# Patient Record
Sex: Female | Born: 1945 | Marital: Single | Smoking: Never smoker
Health system: Southern US, Community
[De-identification: ages and names within clinical notes are randomized; demographics above are authoritative.]

## PROBLEM LIST (undated history)

## (undated) DIAGNOSIS — I10 Essential (primary) hypertension: Secondary | ICD-10-CM

## (undated) DIAGNOSIS — E785 Hyperlipidemia, unspecified: Secondary | ICD-10-CM

## (undated) DIAGNOSIS — E119 Type 2 diabetes mellitus without complications: Secondary | ICD-10-CM

## (undated) DIAGNOSIS — N185 Chronic kidney disease, stage 5: Secondary | ICD-10-CM

## (undated) DIAGNOSIS — Z8673 Personal history of transient ischemic attack (TIA), and cerebral infarction without residual deficits: Secondary | ICD-10-CM

---

## 2004-05-06 ENCOUNTER — Emergency Department: Payer: Self-pay | Admitting: Emergency Medicine

## 2004-06-29 ENCOUNTER — Ambulatory Visit: Payer: Self-pay | Admitting: Internal Medicine

## 2005-07-09 ENCOUNTER — Ambulatory Visit: Payer: Self-pay | Admitting: Internal Medicine

## 2005-12-21 ENCOUNTER — Ambulatory Visit: Payer: Self-pay

## 2006-01-27 ENCOUNTER — Ambulatory Visit: Payer: Self-pay | Admitting: Otolaryngology

## 2006-01-27 ENCOUNTER — Other Ambulatory Visit: Payer: Self-pay

## 2006-02-02 ENCOUNTER — Ambulatory Visit: Payer: Self-pay | Admitting: Otolaryngology

## 2006-02-17 ENCOUNTER — Ambulatory Visit: Payer: Self-pay | Admitting: Oncology

## 2006-05-26 ENCOUNTER — Ambulatory Visit: Payer: Self-pay | Admitting: Oncology

## 2006-06-12 ENCOUNTER — Ambulatory Visit: Payer: Self-pay | Admitting: Oncology

## 2006-07-12 ENCOUNTER — Ambulatory Visit: Payer: Self-pay | Admitting: Internal Medicine

## 2006-12-12 ENCOUNTER — Ambulatory Visit: Payer: Self-pay | Admitting: Oncology

## 2006-12-15 ENCOUNTER — Ambulatory Visit: Payer: Self-pay | Admitting: Oncology

## 2007-01-12 ENCOUNTER — Ambulatory Visit: Payer: Self-pay | Admitting: Oncology

## 2007-03-14 ENCOUNTER — Ambulatory Visit: Payer: Self-pay | Admitting: Internal Medicine

## 2007-04-19 ENCOUNTER — Ambulatory Visit: Payer: Self-pay | Admitting: Internal Medicine

## 2007-05-17 ENCOUNTER — Ambulatory Visit: Payer: Self-pay | Admitting: Internal Medicine

## 2007-06-20 ENCOUNTER — Ambulatory Visit: Payer: Self-pay | Admitting: Oncology

## 2007-07-06 ENCOUNTER — Ambulatory Visit: Payer: Self-pay | Admitting: Oncology

## 2007-07-12 ENCOUNTER — Ambulatory Visit: Payer: Self-pay | Admitting: Oncology

## 2007-07-19 ENCOUNTER — Ambulatory Visit: Payer: Self-pay | Admitting: Internal Medicine

## 2008-06-11 ENCOUNTER — Ambulatory Visit: Payer: Self-pay | Admitting: Oncology

## 2008-07-01 ENCOUNTER — Ambulatory Visit: Payer: Self-pay | Admitting: Nephrology

## 2008-07-04 ENCOUNTER — Ambulatory Visit: Payer: Self-pay | Admitting: Oncology

## 2008-07-11 ENCOUNTER — Ambulatory Visit: Payer: Self-pay | Admitting: Oncology

## 2008-08-27 ENCOUNTER — Emergency Department: Payer: Self-pay

## 2008-09-19 ENCOUNTER — Ambulatory Visit: Payer: Self-pay | Admitting: Internal Medicine

## 2009-09-22 ENCOUNTER — Ambulatory Visit: Payer: Self-pay | Admitting: Internal Medicine

## 2010-07-13 ENCOUNTER — Ambulatory Visit: Payer: Self-pay

## 2010-07-18 ENCOUNTER — Inpatient Hospital Stay: Payer: Self-pay | Admitting: Internal Medicine

## 2010-07-28 ENCOUNTER — Ambulatory Visit: Payer: Self-pay

## 2010-10-26 ENCOUNTER — Ambulatory Visit: Payer: Self-pay

## 2011-11-10 ENCOUNTER — Ambulatory Visit: Payer: Self-pay

## 2012-07-03 ENCOUNTER — Inpatient Hospital Stay: Payer: Self-pay | Admitting: Internal Medicine

## 2012-07-03 DIAGNOSIS — I214 Non-ST elevation (NSTEMI) myocardial infarction: Secondary | ICD-10-CM

## 2012-07-03 LAB — URINALYSIS, COMPLETE
Bilirubin,UR: NEGATIVE
Protein: >=500
RBC,UR: 5 /HPF (ref 0–5)
Specific Gravity: 1.012 (ref 1.003–1.030)
Squamous Epithelial: 11
WBC UR: 10 /HPF (ref 0–5)

## 2012-07-03 LAB — COMPREHENSIVE METABOLIC PANEL
Anion Gap: 11 (ref 7–16)
Bilirubin,Total: 0.4 mg/dL (ref 0.2–1.0)
Chloride: 104 mmol/L (ref 98–107)
Co2: 23 mmol/L (ref 21–32)
Creatinine: 4.8 mg/dL — ABNORMAL HIGH (ref 0.60–1.30)
SGOT(AST): 112 U/L — ABNORMAL HIGH (ref 15–37)
SGPT (ALT): 27 U/L (ref 12–78)

## 2012-07-03 LAB — CBC
MCHC: 32.2 g/dL (ref 32.0–36.0)
MCV: 70 fL — ABNORMAL LOW (ref 80–100)

## 2012-07-03 LAB — LIPID PANEL
HDL Cholesterol: 46 mg/dL (ref 40–60)
VLDL Cholesterol, Calc: 30 mg/dL (ref 5–40)

## 2012-07-03 LAB — HEMOGLOBIN A1C: Hemoglobin A1C: 6.5 % — ABNORMAL HIGH (ref 4.2–6.3)

## 2012-07-04 DIAGNOSIS — I059 Rheumatic mitral valve disease, unspecified: Secondary | ICD-10-CM

## 2012-07-04 DIAGNOSIS — I214 Non-ST elevation (NSTEMI) myocardial infarction: Secondary | ICD-10-CM

## 2012-07-04 LAB — CBC WITH DIFFERENTIAL/PLATELET
Basophil #: 0.1 10*3/uL (ref 0.0–0.1)
Basophil %: 0.4 %
Basophil %: 0.3 %
HCT: 28.9 % — ABNORMAL LOW (ref 35.0–47.0)
HCT: 32.8 % — ABNORMAL LOW (ref 35.0–47.0)
HGB: 9.1 g/dL — ABNORMAL LOW (ref 12.0–16.0)
HGB: 10.2 g/dL — ABNORMAL LOW (ref 12.0–16.0)
Lymphocyte #: 2 10*3/uL (ref 1.0–3.6)
Lymphocyte #: 1.5 10*3/uL (ref 1.0–3.6)
Lymphocyte %: 15 %
MCH: 22 pg — ABNORMAL LOW (ref 26.0–34.0)
MCHC: 31.6 g/dL — ABNORMAL LOW (ref 32.0–36.0)
MCHC: 31.2 g/dL — ABNORMAL LOW (ref 32.0–36.0)
MCV: 70 fL — ABNORMAL LOW (ref 80–100)
Monocyte #: 1.2 x10 3/mm — ABNORMAL HIGH (ref 0.2–0.9)
Neutrophil #: 15.3 10*3/uL — ABNORMAL HIGH (ref 1.4–6.5)
Neutrophil %: 74.7 %
RBC: 4.16 10*6/uL (ref 3.80–5.20)
RDW: 14.5 % (ref 11.5–14.5)
RDW: 14.5 % (ref 11.5–14.5)

## 2012-07-04 LAB — BASIC METABOLIC PANEL
BUN: 58 mg/dL — ABNORMAL HIGH (ref 7–18)
Chloride: 108 mmol/L — ABNORMAL HIGH (ref 98–107)
Co2: 19 mmol/L — ABNORMAL LOW (ref 21–32)
EGFR (Non-African Amer.): 9 — ABNORMAL LOW

## 2012-07-04 LAB — CK TOTAL AND CKMB (NOT AT ARMC)
CK, Total: 267 U/L — ABNORMAL HIGH (ref 21–215)
CK-MB: 18.9 ng/mL — ABNORMAL HIGH (ref 0.5–3.6)
CK-MB: 11.9 ng/mL — ABNORMAL HIGH (ref 0.5–3.6)

## 2012-07-04 LAB — APTT
Activated PTT: 102.7 secs — ABNORMAL HIGH (ref 23.6–35.9)
Activated PTT: 75.7 secs — ABNORMAL HIGH (ref 23.6–35.9)

## 2012-07-04 LAB — TROPONIN I: Troponin-I: 21 ng/mL — ABNORMAL HIGH

## 2012-07-05 ENCOUNTER — Other Ambulatory Visit: Payer: Self-pay | Admitting: *Deleted

## 2012-07-05 ENCOUNTER — Inpatient Hospital Stay (HOSPITAL_COMMUNITY): Payer: Medicare Other

## 2012-07-05 ENCOUNTER — Encounter (HOSPITAL_COMMUNITY): Payer: Self-pay | Admitting: Student

## 2012-07-05 ENCOUNTER — Encounter (HOSPITAL_COMMUNITY): Payer: Medicare Other

## 2012-07-05 ENCOUNTER — Inpatient Hospital Stay (HOSPITAL_COMMUNITY)
Admission: AD | Admit: 2012-07-05 | Discharge: 2012-08-11 | DRG: 208 | Disposition: E | Payer: Medicare Other | Source: Other Acute Inpatient Hospital | Attending: Cardiovascular Disease | Admitting: Cardiovascular Disease

## 2012-07-05 ENCOUNTER — Encounter (HOSPITAL_COMMUNITY)
Admission: AD | Disposition: E | Payer: Self-pay | Source: Other Acute Inpatient Hospital | Attending: Cardiovascular Disease

## 2012-07-05 ENCOUNTER — Ambulatory Visit (HOSPITAL_COMMUNITY): Admit: 2012-07-05 | Payer: Self-pay | Admitting: Cardiovascular Disease

## 2012-07-05 DIAGNOSIS — D509 Iron deficiency anemia, unspecified: Secondary | ICD-10-CM | POA: Diagnosis present

## 2012-07-05 DIAGNOSIS — E872 Acidosis, unspecified: Secondary | ICD-10-CM | POA: Diagnosis not present

## 2012-07-05 DIAGNOSIS — I255 Ischemic cardiomyopathy: Secondary | ICD-10-CM | POA: Diagnosis present

## 2012-07-05 DIAGNOSIS — I447 Left bundle-branch block, unspecified: Secondary | ICD-10-CM | POA: Diagnosis present

## 2012-07-05 DIAGNOSIS — R5381 Other malaise: Secondary | ICD-10-CM | POA: Diagnosis present

## 2012-07-05 DIAGNOSIS — I959 Hypotension, unspecified: Secondary | ICD-10-CM

## 2012-07-05 DIAGNOSIS — I69998 Other sequelae following unspecified cerebrovascular disease: Secondary | ICD-10-CM

## 2012-07-05 DIAGNOSIS — N179 Acute kidney failure, unspecified: Secondary | ICD-10-CM | POA: Diagnosis present

## 2012-07-05 DIAGNOSIS — M25519 Pain in unspecified shoulder: Secondary | ICD-10-CM | POA: Diagnosis present

## 2012-07-05 DIAGNOSIS — E785 Hyperlipidemia, unspecified: Secondary | ICD-10-CM | POA: Insufficient documentation

## 2012-07-05 DIAGNOSIS — I2510 Atherosclerotic heart disease of native coronary artery without angina pectoris: Secondary | ICD-10-CM

## 2012-07-05 DIAGNOSIS — N2581 Secondary hyperparathyroidism of renal origin: Secondary | ICD-10-CM | POA: Diagnosis present

## 2012-07-05 DIAGNOSIS — J189 Pneumonia, unspecified organism: Secondary | ICD-10-CM | POA: Diagnosis present

## 2012-07-05 DIAGNOSIS — I214 Non-ST elevation (NSTEMI) myocardial infarction: Secondary | ICD-10-CM | POA: Diagnosis present

## 2012-07-05 DIAGNOSIS — R791 Abnormal coagulation profile: Secondary | ICD-10-CM | POA: Diagnosis present

## 2012-07-05 DIAGNOSIS — J9600 Acute respiratory failure, unspecified whether with hypoxia or hypercapnia: Secondary | ICD-10-CM | POA: Diagnosis present

## 2012-07-05 DIAGNOSIS — E119 Type 2 diabetes mellitus without complications: Secondary | ICD-10-CM

## 2012-07-05 DIAGNOSIS — I498 Other specified cardiac arrhythmias: Secondary | ICD-10-CM | POA: Diagnosis present

## 2012-07-05 DIAGNOSIS — Z8673 Personal history of transient ischemic attack (TIA), and cerebral infarction without residual deficits: Secondary | ICD-10-CM | POA: Insufficient documentation

## 2012-07-05 DIAGNOSIS — I120 Hypertensive chronic kidney disease with stage 5 chronic kidney disease or end stage renal disease: Secondary | ICD-10-CM | POA: Diagnosis present

## 2012-07-05 DIAGNOSIS — Z794 Long term (current) use of insulin: Secondary | ICD-10-CM

## 2012-07-05 DIAGNOSIS — Z515 Encounter for palliative care: Secondary | ICD-10-CM

## 2012-07-05 DIAGNOSIS — I10 Essential (primary) hypertension: Secondary | ICD-10-CM | POA: Diagnosis present

## 2012-07-05 DIAGNOSIS — G9340 Encephalopathy, unspecified: Secondary | ICD-10-CM | POA: Diagnosis present

## 2012-07-05 DIAGNOSIS — J810 Acute pulmonary edema: Secondary | ICD-10-CM

## 2012-07-05 DIAGNOSIS — R4182 Altered mental status, unspecified: Secondary | ICD-10-CM | POA: Diagnosis present

## 2012-07-05 DIAGNOSIS — I219 Acute myocardial infarction, unspecified: Secondary | ICD-10-CM

## 2012-07-05 DIAGNOSIS — I059 Rheumatic mitral valve disease, unspecified: Secondary | ICD-10-CM

## 2012-07-05 DIAGNOSIS — N039 Chronic nephritic syndrome with unspecified morphologic changes: Secondary | ICD-10-CM | POA: Diagnosis present

## 2012-07-05 DIAGNOSIS — N185 Chronic kidney disease, stage 5: Secondary | ICD-10-CM

## 2012-07-05 DIAGNOSIS — N186 End stage renal disease: Secondary | ICD-10-CM | POA: Diagnosis present

## 2012-07-05 DIAGNOSIS — R5383 Other fatigue: Secondary | ICD-10-CM | POA: Diagnosis present

## 2012-07-05 DIAGNOSIS — I2589 Other forms of chronic ischemic heart disease: Secondary | ICD-10-CM | POA: Diagnosis present

## 2012-07-05 DIAGNOSIS — E1169 Type 2 diabetes mellitus with other specified complication: Secondary | ICD-10-CM | POA: Diagnosis present

## 2012-07-05 DIAGNOSIS — T68XXXA Hypothermia, initial encounter: Secondary | ICD-10-CM | POA: Diagnosis not present

## 2012-07-05 DIAGNOSIS — D631 Anemia in chronic kidney disease: Secondary | ICD-10-CM | POA: Diagnosis present

## 2012-07-05 DIAGNOSIS — Z7401 Bed confinement status: Secondary | ICD-10-CM

## 2012-07-05 DIAGNOSIS — R570 Cardiogenic shock: Secondary | ICD-10-CM | POA: Diagnosis present

## 2012-07-05 DIAGNOSIS — Z8249 Family history of ischemic heart disease and other diseases of the circulatory system: Secondary | ICD-10-CM

## 2012-07-05 HISTORY — DX: Essential (primary) hypertension: I10

## 2012-07-05 HISTORY — DX: Chronic kidney disease, stage 5: N18.5

## 2012-07-05 HISTORY — DX: Personal history of transient ischemic attack (TIA), and cerebral infarction without residual deficits: Z86.73

## 2012-07-05 HISTORY — DX: Type 2 diabetes mellitus without complications: E11.9

## 2012-07-05 HISTORY — DX: Hyperlipidemia, unspecified: E78.5

## 2012-07-05 LAB — CBC WITH DIFFERENTIAL/PLATELET
Basophil #: 0 10*3/uL (ref 0.0–0.1)
Basophils Relative: 0 % (ref 0–1)
Eosinophil #: 0 10*3/uL (ref 0.0–0.7)
Eosinophils Absolute: 0 10*3/uL (ref 0.0–0.7)
Eosinophils Relative: 0 % (ref 0–5)
HGB: 10.4 g/dL — ABNORMAL LOW (ref 12.0–16.0)
Hemoglobin: 10.8 g/dL — ABNORMAL LOW (ref 12.0–15.0)
Lymphocyte #: 1.6 10*3/uL (ref 1.0–3.6)
Lymphocyte %: 8.3 %
Lymphocytes Relative: 7 % — ABNORMAL LOW (ref 12–46)
MCHC: 33.2 g/dL (ref 30.0–36.0)
Neutrophils Relative %: 87 % — ABNORMAL HIGH (ref 43–77)
RBC: 4.81 MIL/uL (ref 3.87–5.11)
RDW: 14.6 % — ABNORMAL HIGH (ref 11.5–14.5)
WBC: 19.4 10*3/uL — ABNORMAL HIGH (ref 3.6–11.0)

## 2012-07-05 LAB — POCT I-STAT 3, ART BLOOD GAS (G3+)
Acid-base deficit: 3 mmol/L — ABNORMAL HIGH (ref 0.0–2.0)
Bicarbonate: 20 mEq/L (ref 20.0–24.0)
O2 Saturation: 96 %
Patient temperature: 98.6
pO2, Arterial: 79 mmHg — ABNORMAL LOW (ref 80.0–100.0)

## 2012-07-05 LAB — HEMOGLOBIN A1C
Hgb A1c MFr Bld: 6.4 % — ABNORMAL HIGH (ref <5.7)
Mean Plasma Glucose: 137 mg/dL — ABNORMAL HIGH (ref <117)

## 2012-07-05 LAB — URINALYSIS, ROUTINE W REFLEX MICROSCOPIC
Bilirubin Urine: NEGATIVE
Glucose, UA: NEGATIVE mg/dL
Ketones, ur: NEGATIVE mg/dL
Nitrite: NEGATIVE
Protein, ur: 100 mg/dL — AB
Specific Gravity, Urine: 1.014 (ref 1.005–1.030)
Urobilinogen, UA: 0.2 mg/dL (ref 0.0–1.0)
pH: 5.5 (ref 5.0–8.0)

## 2012-07-05 LAB — BASIC METABOLIC PANEL
Calcium, Total: 9.1 mg/dL (ref 8.5–10.1)
Co2: 22 mmol/L (ref 21–32)
Creatinine: 4.95 mg/dL — ABNORMAL HIGH (ref 0.60–1.30)
EGFR (African American): 10 — ABNORMAL LOW
Osmolality: 290 (ref 275–301)
Potassium: 3.5 mmol/L (ref 3.5–5.1)

## 2012-07-05 LAB — URINE MICROSCOPIC-ADD ON

## 2012-07-05 LAB — COMPREHENSIVE METABOLIC PANEL
Albumin: 2.4 g/dL — ABNORMAL LOW (ref 3.5–5.2)
Alkaline Phosphatase: 63 U/L (ref 39–117)
BUN: 53 mg/dL — ABNORMAL HIGH (ref 6–23)
Calcium: 9.2 mg/dL (ref 8.4–10.5)
Potassium: 3.1 mEq/L — ABNORMAL LOW (ref 3.5–5.1)
Total Protein: 6.6 g/dL (ref 6.0–8.3)

## 2012-07-05 LAB — HEPARIN LEVEL (UNFRACTIONATED): Heparin Unfractionated: 0.65 IU/mL (ref 0.30–0.70)

## 2012-07-05 LAB — PROTIME-INR: Prothrombin Time: 14.6 seconds (ref 11.6–15.2)

## 2012-07-05 LAB — TROPONIN I: Troponin I: 10.83 ng/mL (ref <0.30)

## 2012-07-05 LAB — SURGICAL PCR SCREEN
MRSA, PCR: NEGATIVE
Staphylococcus aureus: NEGATIVE

## 2012-07-05 LAB — PRO B NATRIURETIC PEPTIDE: Pro B Natriuretic peptide (BNP): 49043 pg/mL — ABNORMAL HIGH (ref 0–125)

## 2012-07-05 LAB — MRSA PCR SCREENING: MRSA by PCR: NEGATIVE

## 2012-07-05 LAB — MAGNESIUM: Magnesium: 2 mg/dL (ref 1.5–2.5)

## 2012-07-05 SURGERY — LEFT HEART CATHETERIZATION WITH CORONARY ANGIOGRAM
Anesthesia: LOCAL

## 2012-07-05 MED ORDER — HEPARIN (PORCINE) IN NACL 100-0.45 UNIT/ML-% IJ SOLN
850.0000 [IU]/h | INTRAMUSCULAR | Status: DC
  Administered 2012-07-05: 850 [IU]/h via INTRAVENOUS
  Filled 2012-07-05: qty 250

## 2012-07-05 MED ORDER — DEXTROSE 10 % IV SOLN
INTRAVENOUS | Status: DC
  Administered 2012-07-06: via INTRAVENOUS

## 2012-07-05 MED ORDER — HEPARIN (PORCINE) 2000 UNITS/L FOR CRRT
INTRAVENOUS_CENTRAL | Status: DC | PRN
  Administered 2012-07-05 – 2012-07-15 (×2): via INTRAVENOUS_CENTRAL
  Filled 2012-07-05: qty 1000

## 2012-07-05 MED ORDER — BIOTENE DRY MOUTH MT LIQD
15.0000 mL | OROMUCOSAL | Status: DC
  Administered 2012-07-05 – 2012-07-07 (×12): 15 mL via OROMUCOSAL

## 2012-07-05 MED ORDER — PRISMASOL BGK 4/2.5 32-4-2.5 MEQ/L IV SOLN
INTRAVENOUS | Status: DC
  Administered 2012-07-05 – 2012-07-06 (×3): via INTRAVENOUS_CENTRAL
  Filled 2012-07-05 (×6): qty 5000

## 2012-07-05 MED ORDER — DEXTROSE 50 % IV SOLN
INTRAVENOUS | Status: AC
  Administered 2012-07-05: 12.5 g
  Filled 2012-07-05: qty 50

## 2012-07-05 MED ORDER — PRISMASOL BGK 4/2.5 32-4-2.5 MEQ/L IV SOLN
INTRAVENOUS | Status: DC
  Administered 2012-07-05 – 2012-07-07 (×11): via INTRAVENOUS_CENTRAL
  Filled 2012-07-05 (×17): qty 5000

## 2012-07-05 MED ORDER — NITROGLYCERIN 0.2 MG/ML ON CALL CATH LAB
INTRAVENOUS | Status: AC
  Filled 2012-07-05: qty 1

## 2012-07-05 MED ORDER — FENTANYL BOLUS VIA INFUSION
25.0000 ug | Freq: Four times a day (QID) | INTRAVENOUS | Status: DC | PRN
  Filled 2012-07-05: qty 100

## 2012-07-05 MED ORDER — ASPIRIN 325 MG PO TABS
325.0000 mg | ORAL_TABLET | Freq: Every day | ORAL | Status: DC
  Administered 2012-07-06 – 2012-07-14 (×8): 325 mg via ORAL
  Filled 2012-07-05 (×10): qty 1

## 2012-07-05 MED ORDER — PRISMASOL BGK 4/2.5 32-4-2.5 MEQ/L IV SOLN
INTRAVENOUS | Status: DC
  Administered 2012-07-05 – 2012-07-06 (×2): via INTRAVENOUS_CENTRAL
  Filled 2012-07-05 (×3): qty 5000

## 2012-07-05 MED ORDER — HEPARIN (PORCINE) IN NACL 2-0.9 UNIT/ML-% IJ SOLN
INTRAMUSCULAR | Status: AC
  Filled 2012-07-05: qty 1000

## 2012-07-05 MED ORDER — PROPOFOL 10 MG/ML IV EMUL
5.0000 ug/kg/min | INTRAVENOUS | Status: DC
  Administered 2012-07-05 – 2012-07-06 (×2): 25 ug/kg/min via INTRAVENOUS
  Filled 2012-07-05 (×4): qty 100

## 2012-07-05 MED ORDER — PARICALCITOL 1 MCG PO CAPS
1.0000 ug | ORAL_CAPSULE | Freq: Every day | ORAL | Status: DC
  Administered 2012-07-06 – 2012-07-11 (×5): 1 ug via ORAL
  Filled 2012-07-05 (×7): qty 1

## 2012-07-05 MED ORDER — INSULIN ASPART 100 UNIT/ML ~~LOC~~ SOLN
2.0000 [IU] | SUBCUTANEOUS | Status: DC
  Administered 2012-07-06: 6 [IU] via SUBCUTANEOUS
  Administered 2012-07-06: 4 [IU] via SUBCUTANEOUS
  Administered 2012-07-06: 2 [IU] via SUBCUTANEOUS
  Administered 2012-07-07: 4 [IU] via SUBCUTANEOUS
  Administered 2012-07-07: 6 [IU] via SUBCUTANEOUS
  Administered 2012-07-07: 4 [IU] via SUBCUTANEOUS
  Administered 2012-07-07: 6 [IU] via SUBCUTANEOUS
  Administered 2012-07-08: 4 [IU] via SUBCUTANEOUS
  Administered 2012-07-08 (×2): 2 [IU] via SUBCUTANEOUS
  Administered 2012-07-08: 6 [IU] via SUBCUTANEOUS
  Administered 2012-07-08 (×2): 4 [IU] via SUBCUTANEOUS
  Administered 2012-07-09 (×2): 2 [IU] via SUBCUTANEOUS
  Administered 2012-07-09 (×3): 4 [IU] via SUBCUTANEOUS
  Administered 2012-07-10: 2 [IU] via SUBCUTANEOUS
  Administered 2012-07-10: 4 [IU] via SUBCUTANEOUS
  Administered 2012-07-10 (×3): 2 [IU] via SUBCUTANEOUS
  Administered 2012-07-11 (×3): 4 [IU] via SUBCUTANEOUS
  Administered 2012-07-11: 6 [IU] via SUBCUTANEOUS
  Administered 2012-07-11 – 2012-07-12 (×3): 4 [IU] via SUBCUTANEOUS
  Administered 2012-07-12: 2 [IU] via SUBCUTANEOUS
  Administered 2012-07-12 – 2012-07-13 (×3): 4 [IU] via SUBCUTANEOUS
  Administered 2012-07-13 (×3): 2 [IU] via SUBCUTANEOUS
  Administered 2012-07-13: 4 [IU] via SUBCUTANEOUS
  Administered 2012-07-14 (×4): 2 [IU] via SUBCUTANEOUS

## 2012-07-05 MED ORDER — SODIUM CHLORIDE 0.9 % IV SOLN
25.0000 ug/h | INTRAVENOUS | Status: DC
  Administered 2012-07-05: 150 ug/h via INTRAVENOUS
  Administered 2012-07-06: 100 ug/h via INTRAVENOUS
  Filled 2012-07-05 (×3): qty 50

## 2012-07-05 MED ORDER — PANTOPRAZOLE SODIUM 40 MG IV SOLR
40.0000 mg | Freq: Every day | INTRAVENOUS | Status: DC
  Administered 2012-07-05 – 2012-07-06 (×2): 40 mg via INTRAVENOUS
  Filled 2012-07-05 (×3): qty 40

## 2012-07-05 MED ORDER — DEXTROSE 50 % IV SOLN
12.5000 g | Freq: Once | INTRAVENOUS | Status: AC
  Administered 2012-07-06: 12.5 g via INTRAVENOUS

## 2012-07-05 MED ORDER — HEPARIN SODIUM (PORCINE) 1000 UNIT/ML DIALYSIS
1000.0000 [IU] | INTRAMUSCULAR | Status: DC | PRN
  Administered 2012-07-07: 4200 [IU] via INTRAVENOUS_CENTRAL
  Administered 2012-07-15: 1200 [IU] via INTRAVENOUS_CENTRAL
  Filled 2012-07-05 (×2): qty 3
  Filled 2012-07-05: qty 6
  Filled 2012-07-05: qty 2

## 2012-07-05 MED ORDER — CARVEDILOL 12.5 MG PO TABS
12.5000 mg | ORAL_TABLET | Freq: Two times a day (BID) | ORAL | Status: DC
  Filled 2012-07-05 (×3): qty 1

## 2012-07-05 MED ORDER — FERROUS SULFATE 325 (65 FE) MG PO TABS
325.0000 mg | ORAL_TABLET | Freq: Every day | ORAL | Status: DC
  Administered 2012-07-06 – 2012-07-08 (×3): 325 mg via ORAL
  Filled 2012-07-05 (×6): qty 1

## 2012-07-05 MED ORDER — ASPIRIN EC 81 MG PO TBEC: 81.0000 mg | DELAYED_RELEASE_TABLET | Freq: Every day | ORAL | Status: DC

## 2012-07-05 MED ORDER — HEPARIN (PORCINE) IN NACL 100-0.45 UNIT/ML-% IJ SOLN
1200.0000 [IU]/h | INTRAMUSCULAR | Status: DC
  Administered 2012-07-06 – 2012-07-07 (×2): 850 [IU]/h via INTRAVENOUS
  Administered 2012-07-08 – 2012-07-09 (×2): 1050 [IU]/h via INTRAVENOUS
  Administered 2012-07-10 (×2): 1200 [IU]/h via INTRAVENOUS
  Filled 2012-07-05 (×11): qty 250

## 2012-07-05 MED ORDER — SODIUM BICARBONATE 650 MG PO TABS
650.0000 mg | ORAL_TABLET | Freq: Four times a day (QID) | ORAL | Status: DC
  Administered 2012-07-05: 650 mg via ORAL
  Filled 2012-07-05 (×5): qty 1

## 2012-07-05 MED ORDER — CHLORHEXIDINE GLUCONATE 0.12 % MT SOLN
15.0000 mL | Freq: Two times a day (BID) | OROMUCOSAL | Status: DC
  Administered 2012-07-05 – 2012-07-07 (×4): 15 mL via OROMUCOSAL
  Filled 2012-07-05 (×5): qty 15

## 2012-07-05 MED ORDER — LIDOCAINE HCL (PF) 1 % IJ SOLN
INTRAMUSCULAR | Status: AC
  Filled 2012-07-05: qty 30

## 2012-07-05 MED ORDER — ATORVASTATIN CALCIUM 80 MG PO TABS
80.0000 mg | ORAL_TABLET | Freq: Every day | ORAL | Status: DC
  Administered 2012-07-06 – 2012-07-14 (×7): 80 mg via ORAL
  Filled 2012-07-05 (×11): qty 1

## 2012-07-05 MED ORDER — NITROGLYCERIN 0.4 MG SL SUBL: 0.4000 mg | SUBLINGUAL_TABLET | SUBLINGUAL | Status: DC | PRN

## 2012-07-05 MED ORDER — SODIUM CHLORIDE 0.9 % IV SOLN
INTRAVENOUS | Status: DC
  Administered 2012-07-05: 17:00:00 via INTRAVENOUS

## 2012-07-05 MED ORDER — RENA-VITE PO TABS
1.0000 | ORAL_TABLET | Freq: Every day | ORAL | Status: DC
  Administered 2012-07-06 – 2012-07-14 (×7): 1 via ORAL
  Filled 2012-07-05 (×11): qty 1

## 2012-07-05 NOTE — Progress Notes (Signed)
  Echocardiogram 2D Echocardiogram has been performed.  Georgian Co 07/04/2012, 3:24 PM

## 2012-07-05 NOTE — Progress Notes (Signed)
eLink Physician-Brief Progress Note Patient Name: Barbara Montoya DOB: Apr 04, 1945 MRN: [id number]  Date of Service  06/17/2012   HPI/Events of Note   Hypoglycemia  eICU Interventions   D10@50    Intervention Category Major Interventions: Other:  Lonia Farber 06/23/2012, 11:49 PM

## 2012-07-05 NOTE — Consult Note (Signed)
[address]       [place] [address]             [phone]        Barbara Montoya [facility] Medical Record #[id number] Date of Birth: [birth year]  Referring: Lorine Bears Primary Care: Lyndon Code Primary Nephrologist: Mosetta Pigeon  Chief Complaint:  Left arm pain and dyspnea on exertion   History of Present Illness:     This is a 67 year old female with cardiac risk factors that include hypertension, diabetes mellitus, hyperlipidemia as well as a past medical history of CVA, CKD stage V (last Cr 4.72, GFR 10), and anemia of chronic disease. She presented to [facility] ER on 07/03/2012 with complaints of dyspnea on exertion, nausea, and worsening of left arm pain radiating to her jaw.She denied chest pain. EKG showed changes in LBBB (seen previously on EKG in 2012) with significant ST depressions in V5 and V6. Her troponin peaked at 23. She ruled in for a NSTEMI. She was placed on a heparin drip.     BUN and Cr upon admission were 58 and 4.8.A nephrology consult was obtained. It was decided that she required dialysis on this admission. The patient was interested in a PD catheter, but she needed acute access for now. She had a Permcath placed in her right jugular on 6/24 by Dr. Gilda Crease at [facility]. An Echo was done on 6/24. Results showed LVEF 40-45%, severe hypokinesis of the anterior, anteroseptal, and apical regions. Also, moderate MV regurgitation, mild to moderated TV regurgitation, mild PV regurgitation, no AS or aortic regurgitation.Dr. Kirke Corin was then consulted and performed a cardiac catheterization on 07/04/2012. Results showed mid LAD stenosis 99%, ramus intermediate 90%, proximal RCA with a 70% stenosis, PDA 100% , and PLS 90% stenosis.      She had a WBC of 16,100 upon admission to [facility]. It was felt she may have PNA (posssible infiltrate RLL) so she was placed on Levaquin. CXR done 6/24 showed findings consistent with pulmonary edema.She underwent HD the evening of  6/24. She became hypoxic and developed respiratory distress. HD was stopped. ABG shoed PH=7.26, PCO2=51, and PO2 = < 31.She was put on a NRB, given Lasix IV, and pulmonary was consulted. Her respiratory status improved. She then had HD again today. Again, she became hypoxic and developed respiratory distress. She ultimately needed to be intubated. Dr. Molli Knock was then contacted to accept the patient in transfer to [facility]. A cardiothoracic surgery consult was obtained for the consideration of coronary artery bypass grafting surgery. Dr. Donata Clay will also review the echo to determine if a mitral valve repair vs replacement would also be necessary.  Current Activity/ Functional Status: Intubated but responsive   Zubrod Score: At the time of surgery this patient's most appropriate activity status/level should be described as: []  Normal activity, no symptoms [x]  Symptoms, fully ambulatory []  Symptoms, in bed less than or equal to 50% of the time []  Symptoms, in bed greater than 50% of the time but less than 100% []  Bedridden []  Moribund  Past Medical History  Diagnosis Date  . Hypertension   . Diabetes mellitus   . H/O: CVA (cerebrovascular accident)   . CKD (chronic kidney disease), stage V   . 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 Non smoker  Smokeless tobacco  .  Denies    History  Alcohol Use: None Not on file    History   Social History  . Marital Status: Widowed    Spouse Name: N/A    Number of Children: 3  . Years of Education: N/A   Occupational History  . Not on file.   Social History Main Topics  . Smoking status: Non smoker  . Smokeless tobacco: Not on file  . Alcohol Use: None  . Drug Use: Not on file  . Sexually Active: Not on file   Family History: Husband is deceased  from MI 61 year old son has hypertension 7 year old daughter has  hypertension 50 year old daughter has hypertension    Home Medications: 1. Amlodipine 10 mg po daily 2. Aspirin 325 mg po daily 3.Atorvastatin 40 mg po daily 4.Coreg 25 mg po daily 5.Ferrous sulfate 325 mg po daily 6.Glimiperide 4 mg po daily 7.Lasix 40 mg po daily 8.Nephro-Vite B complex one table po daily 9.Sodium bicarbonate 650 mg po bid 10.Zemplar 1 mcg po daily 11.Lantus 12 units SQ at hs  Current Facility-Administered Medications  Medication Dose Route Frequency Provider Last Rate Last Dose  . [START ON 06/22/2012] aspirin EC tablet 81 mg  81 mg Oral Daily Iran Ouch, MD      . atorvastatin (LIPITOR) tablet 80 mg  80 mg Oral q1800 Iran Ouch, MD      . fentaNYL (SUBLIMAZE) 10 mcg/mL in sodium chloride 0.9 % 250 mL infusion  25-300 mcg/hr Intravenous Titrated Bernadene Person, NP       And  . fentaNYL (SUBLIMAZE) bolus via infusion 25-100 mcg  25-100 mcg Intravenous Q6H PRN Bernadene Person, NP      . insulin aspart (novoLOG) injection 2-6 Units  2-6 Units Subcutaneous Q4H Bernadene Person, NP      . nitroGLYCERIN (NITROSTAT) SL tablet 0.4 mg  0.4 mg Sublingual Q5 Min x 3 PRN Iran Ouch, MD      . pantoprazole (PROTONIX) injection 40 mg  40 mg Intravenous QHS Bernadene Person, NP      . propofol (DIPRIVAN) 10 mg/ml infusion  5-50 mcg/kg/min Intravenous Titrated Bernadene Person, NP        Prescriptions prior to admission  Medication Sig Dispense Refill  . amLODipine (NORVASC) 10 MG tablet Take 10 mg by mouth daily.      Marland Kitchen aspirin 325 MG tablet Take 325 mg by mouth daily.      Marland Kitchen atorvastatin (LIPITOR) 40 MG tablet Take 40 mg by mouth daily.      Marland Kitchen b complex-vitamin c-folic acid (NEPHRO-VITE) 0.8 MG TABS Take 0.8 mg by mouth at bedtime.      . carvedilol (COREG) 25 MG tablet Take 25 mg by mouth 2 (two) times daily with a meal.      . ferrous sulfate 325 (65 FE) MG tablet Take 325 mg by mouth daily with breakfast.      . furosemide  (LASIX) 40 MG tablet Take 40 mg by mouth.      Marland Kitchen glimepiride (AMARYL) 4 MG tablet Take 4 mg by mouth daily before breakfast.      . insulin glargine (LANTUS) 100 UNIT/ML injection Inject 12 Units into the skin at bedtime.      . paricalcitol (ZEMPLAR) 1 MCG capsule Take 1 mcg by mouth daily.      . sodium bicarbonate 650 MG tablet Take 650 mg by mouth 4 (four) times daily.  Review of Systems:     Cardiac Review of Systems: Y or N  Chest Pain [  N  ]  Resting SOB [  N ] Exertional SOB  [ Y ]  Orthopnea [ N ]   Pedal Edema [ N  ]    Palpitations [  N] Syncope  [ N ]   Presyncope [ N  ]  General Review of Systems: [Y] = yes [ N ]=no Constitional: recent weight change [ N ]; anorexia [N  ]; fatigue [Y  ]; nausea [ Y ]; night sweats Barbara Montoya  ]; fever [ N ]; or chills Barbara Montoya  ]                                                                Eye : blurred vision Barbara Montoya  ]; diplopia [ N  ]; vision changes [ N ];  Amaurosis fugax[ N ]; Resp: cough [ N ];  wheezing[ N ];  hemoptysis[ N ]; shortness of breath[ Y ]; paroxysmal nocturnal dyspnea[N  ]; GI:  gallstones[N  ], vomiting[ N ];  dysphagia[N  ]; melena[ N ];  hematochezia Barbara Montoya  ]; heartburn[ N ];    GU: kidney stones [ N ]; hematuria[ N ];   dysuria [ N ];  nocturia[ N ];  history of  obstruction [ N ]; urinary frequency [ N ]             Skin: rash, swelling[ N ];, hair loss[N  ];  or itching[ N ]; Musculosketetal: myalgias[N  ];  joint swelling[ N ];  joint erythema[ N ];  joint pain[ N ];  back pain[ N ];  Heme/Lymph: bruising[  N];  bleeding[N  ];  anemia[Y  ];  Neuro: TIA[ N ];  headaches[ N ];  stroke[ Y ];  vertigo[N  ];  seizures[ N ];   paresthesias[N  ];  Mild left upper extremity weakness (according to records)  Psych:depression[n  ]; anxiety[ n ];  Endocrine: diabetes[  n];  thyroid dysfunction[ n ];     Physical Exam: BP 130/73  Pulse 91  Resp 20  Ht 5\' 6"  (1.676 m)  Wt 84.1 kg (185 lb 6.5 oz)  BMI 29.94 kg/m2  SpO2 100%  General  appearance: no distress,intubated HEENT: Head:atraumatic, normocephalic. Eyes:EOMI, PERRLA, sclera non icteric Neck: Supple  extremities, hands restrained for safety Heart: RRR, no gallop or murmur, rub heard Lungs: coarse breath sounds bilaterally Abdomen: soft, non-tender; bowel sounds normal; no masses,  no organomegaly Extremities: ted hose on both lower extremites, trace LE edema Neurologic: responds to commands, moves both lower   Diagnostic Studies & Laboratory data:     Recent Radiology Findings:   Dg Chest Port 1 View  Aug 04, 2012   *RADIOLOGY REPORT*  Clinical Data: Intubation, central line placement, hypertension, diabetes  PORTABLE CHEST - 1 VIEW  Comparison: None.  Findings: Endotracheal tube 6 cm above the carina.  Right IJ dialysis catheter tips in the SVC.  Left IJ central line tip in the mid SVC as well.  Normal heart size and vascularity.  Ill-defined increased opacity throughout the left central lung could represent developing airspace disease versus asymmetric edema.  Right lung relatively clear.  No effusion or pneumothorax. NG tube  extends below the hemidiaphragms the tip not visualized.  IMPRESSION: Support apparatus in good position.  No pneumothorax  Ill-defined increased opacity left central lung could represent developing airspace process or asymmetric edema   Original Report Authenticated By: Judie Petit. Miles Costain, M.D.     Recent Lab Findings: No results found for this basename: WBC, HGB, HCT, PLT, GLUCOSE, CHOL, TRIG, HDL, LDLDIRECT, LDLCALC, ALT, AST, NA, K, CL, CREATININE, BUN, CO2, TSH, INR, GLUF, HGBA1C     Assessment / Plan:      1.NSTEMI-on heparin drip.  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 Donata Clay will further evaluate and determine timing of coronary artery bypass grafting surgery.   Doree Fudge PA-C  07/09/2012 2:52 PM

## 2012-07-05 NOTE — Progress Notes (Addendum)
ANTICOAGULATION CONSULT NOTE - Initial Consult  Pharmacy Consult:  Heparin Indication:  ACS  Allergies no known allergies  Patient Measurements: Height: 5\' 6"  (167.6 cm) Weight: 185 lb 6.5 oz (84.1 kg) IBW/kg (Calculated) : 59.3 Heparin Dosing Weight: 77 kg  Vital Signs: BP: 130/73 mmHg (06/25 1400) Pulse Rate: 91 (06/25 1400)  Labs: No results found for this basename: HGB, HCT, PLT, APTT, LABPROT, INR, HEPARINUNFRC, CREATININE, CKTOTAL, CKMB, TROPONINI,  in the last 72 hours  CrCl is unknown because no creatinine reading has been taken.   Medical History: Past Medical History  Diagnosis Date  . Hypertension   . Diabetes mellitus   . H/O: CVA (cerebrovascular accident)   . CKD (chronic kidney disease), stage V   . Hyperlipidemia        Assessment: 72 YOF admitted to [facility] for NSTEMI, now s/p cath and transferred to [facility] with IV heparin infusing.  At [facility], patient was hypoxic while HD was attempted for her CKD Stage V.  Labs from [place] appropriate to continue heparin.   Goal of Therapy:  Heparin level 0.3-0.7 units/ml Monitor platelets by anticoagulation protocol: Yes    Plan:  - Continue heparin gtt at current concentration and rate - STAT heparin level     Thuy D. Laney Potash, PharmD, BCPS Pager:  [phone] 07/09/2012, 2:51 PM   ADDENDUM:     07/06/2012, 3:59 PM     Heparin infusing at 1105 units/hr [Heparin concentration 50 units/ml from [facility]]  Heparin level = 0.65 units/ml, above therapeutic goals. No active bleeding complications noted.  PLAN:  1. Reduce Heparin infusion to 850 units/hr. 2. Change Heparin bag to 100 units/hr [standard [facility] Heparin concentration] 3. Next Heparin level 2300 pm. Daily Heparin level and CBC while on Heparin. Monitor for bleeding complications   Laurena Bering,  Pharm.D. , 4:04 PM

## 2012-07-05 NOTE — Plan of Care (Signed)
Problem: ICU Phase Progression Outcomes Goal: Voiding-avoid urinary catheter unless indicated Outcome: Not Progressing Foley inplaced

## 2012-07-05 NOTE — Progress Notes (Signed)
ANTICOAGULATION CONSULT NOTE - Follow Up Consult  Pharmacy Consult for : Heparin Indication:  ACS  Dosing Weight: 77 kg  Labs:  Recent Labs  07/08/2012 1425  HGB 10.8*  HCT 32.5*  PLT 186  LABPROT 14.6  INR 1.16  HEPARINUNFRC 0.65  CREATININE 4.90*   Lab Results  Component Value Date   INR 1.16 07/01/2012   Medications:  Prescriptions prior to admission  Medication Sig Dispense Refill  . amLODipine (NORVASC) 10 MG tablet Take 10 mg by mouth daily.      Marland Kitchen aspirin 325 MG tablet Take 325 mg by mouth daily.      Marland Kitchen atorvastatin (LIPITOR) 40 MG tablet Take 40 mg by mouth daily.      Marland Kitchen b complex-vitamin c-folic acid (NEPHRO-VITE) 0.8 MG TABS Take 0.8 mg by mouth at bedtime.      . carvedilol (COREG) 25 MG tablet Take 25 mg by mouth 2 (two) times daily with a meal.      . cloNIDine (CATAPRES) 0.1 MG tablet Take 0.1 mg by mouth 3 (three) times daily.      . clopidogrel (PLAVIX) 75 MG tablet Take 75 mg by mouth daily.      . ferrous sulfate 325 (65 FE) MG tablet Take 325 mg by mouth daily with breakfast.      . furosemide (LASIX) 40 MG tablet Take 40 mg by mouth.      Marland Kitchen glimepiride (AMARYL) 4 MG tablet Take 4 mg by mouth daily before breakfast.      . insulin glargine (LANTUS) 100 UNIT/ML injection Inject 12 Units into the skin at bedtime.      . paricalcitol (ZEMPLAR) 1 MCG capsule Take 1 mcg by mouth daily.      . sodium bicarbonate 650 MG tablet Take 650 mg by mouth 4 (four) times daily.       Scheduled:  . antiseptic oral rinse  15 mL Mouth Rinse Q4H  . [START ON July 29, 2012] aspirin  325 mg Oral Daily  . atorvastatin  80 mg Oral q1800  . [START ON Jul 29, 2012] carvedilol  12.5 mg Oral BID WC  . chlorhexidine  15 mL Mouth/Throat BID  . [START ON 07/29/12] ferrous sulfate  325 mg Oral Q breakfast  . insulin aspart  2-6 Units Subcutaneous Q4H  . [START ON 29-Jul-2012] multivitamin  1 tablet Oral Daily  . pantoprazole (PROTONIX) IV  40 mg Intravenous QHS  . [START ON 07-29-12]  paricalcitol  1 mcg Oral Daily  . sodium bicarbonate  650 mg Oral QID   Assessment:  67 y/o AA female transferred to [facility] for treatment/evaluation of Acute MI.  She has a history of CKD with likely progression to ESRD following contrast exposure.  She is to be placed on CRRT.  Patient is also VDRF due to probable combination of acute MI and pulmonary edema and volume overload.  S/P repeat coronary angiography undergoing medical treatment while awaiting CTS consult.  Heparin infusion is to be restarted at Midnight.  Goal of Therapy:  Heparin level 0.3-0.7 units/ml   Plan:  Restart Heparin at Midnight, no bolus, at 850 units/hr.  Will check Heparin level, CBC in 8 hours, then daily.  Monitor for bleeding complications    Rayford Halsted.D 06/23/2012, 8:31 PM

## 2012-07-05 NOTE — CV Procedure (Signed)
    Cardiac Catheterization Procedure Note  Name: Barbara Montoya MRN: [id number] DOB: 05-Sep-1945  Procedure: Left Heart Cath, Selective Coronary Angiography, Mynx closure device  Indication: Recent non-ST elevation myocardial infarction with acute worsening in clinical condition with drop in ejection fraction. There was suspicion of occluded LAD.   Medications:    Contrast:  40 mL Omnipaque  Procedural details: The right groin was prepped, draped, and anesthetized with 1% lidocaine. Using modified Seldinger technique, a 5 French sheath was introduced into the right femoral artery. Standard Judkins catheters were used for coronary angiography and to measure left ventricular pressure. Left ventricular angiography was not performed. Catheter exchanges were performed over a guidewire. There were no immediate procedural complications. The sheath was removed and the site was closed with a Mynx device.   Procedural Findings:  Hemodynamics: AO:  117/64   mmHg LV:  116/14    mmHg LVEDP: 18  mmHg  Coronary angiography: Coronary dominance: Right   Left Main:  Heavily calcified with 30% distal stenosis.  Left Anterior Descending (LAD):  Heavily calcified with diffuse 30% disease proximally. There is a 95% tubular stenosis in the midsegment which is likely the culprit for recent myocardial infarction. The rest of the LAD has minor irregularities.  Circumflex (LCx):  Medium in size and mildly calcified. There is 50% diffuse disease proximally. The OM branches are relatively small in size.  Ramus Intermedius:  Large in size with 90% hazy proximal stenosis.  Right Coronary Artery: This was not imaged. This was admitted yesterday during cardiac catheterization at [facility]. It showed diffuse disease in the proximal and midsegment with an occluded right PDA which gets collaterals from the left system.   Left ventriculography: Was deferred.  Final Conclusions:   1. Significant three-vessel coronary  artery disease with no significant change in coronary anatomy since yesterday. The LAD is patent. 2. Mildly elevated left ventricular end-diastolic pressure.  Recommendations:  Evaluate for CABG once the patient is stable from a respiratory and renal status.  Lorine Bears MD, [facility] 07/09/2012, 6:16 PM

## 2012-07-05 NOTE — Consult Note (Signed)
Reason for Consult: CKD stage 5 with volume overload and need for dialysis Referring Physician: Dr. Lorine Bears  HPI:  Barbara Montoya is a 67 year old African American woman with a known history of hypertension, type 2 diabetes and stage V chronic kidney disease with a baseline GFR of around 10 who has been followed up by [facility]. She presented to [facility] over the past weekend with left arm and shoulder discomfort radiating to her throat. The pain had been intermittent and worsening every night. She denied any chest discomfort. She did notice increased shortness of breath and mild orthopnea. She denies any similar symptoms. She was found to be suffering a NSTEMI (Chronic LBB) and underwent LHC that showed severe 3 vessel disease that would warrant CABG.  Due to her declining renal function, hemodialysis was attempted via tunnel dialysis catheter however she had episodes of hypotension and tachycardia that limited full dialysis treatments. A suspicion of a dialyzer reaction was entertained. She was transferred over to [facility] for further cardiothoracic surgical evaluation. Unfortunately over this time, she had respiratory decline and was intubated for airway protection. She is just back from a repeat coronary angiography to explain an acute drop at ejection fraction from 40% 5 days ago now to 25%. No new interval changes were noted and her LVEDP was 19. We are asked to see her in order to provide dialysis support for volume management/clearance so that she may be better optimized to undergo CABG plus or minus mitral valve repair. It is noted from previous notes that her chosen modality for RRT was PD.     Past Medical History  Diagnosis Date  . Hypertension   . Diabetes mellitus   . H/O: CVA (cerebrovascular accident)   . CKD (chronic kidney disease), stage V   . Hyperlipidemia     No past surgical history on file.  No family history on file.  Social History:  has no tobacco, alcohol,  and drug history on file.  Allergies: No Known Allergies  Medications:  Scheduled: . [START ON 06/16/2012] aspirin  325 mg Oral Daily  . atorvastatin  80 mg Oral q1800  . [START ON 06/29/2012] carvedilol  12.5 mg Oral BID WC  . [START ON 06/15/2012] ferrous sulfate  325 mg Oral Q breakfast  . insulin aspart  2-6 Units Subcutaneous Q4H  . [START ON 06/25/2012] multivitamin  1 tablet Oral Daily  . pantoprazole (PROTONIX) IV  40 mg Intravenous QHS  . [START ON 07/03/2012] paricalcitol  1 mcg Oral Daily  . sodium bicarbonate  650 mg Oral QID    Results for orders placed during the hospital encounter of 07/09/2012 (from the past 48 hour(s))  MRSA PCR SCREENING     Status: None   Collection Time    06/26/2012  1:31 PM      Result Value Range   MRSA by PCR NEGATIVE  NEGATIVE   Comment:            The GeneXpert MRSA Assay (FDA     approved for NASAL specimens     only), is one component of a     comprehensive MRSA colonization     surveillance program. It is not     intended to diagnose MRSA     infection nor to guide or     monitor treatment for     MRSA infections.  TROPONIN I     Status: Abnormal   Collection Time    07/09/2012  2:25 PM  Result Value Range   Troponin I 9.19 (*) <0.30 ng/mL   Comment:            Due to the release kinetics of cTnI,     a negative result within the first hours     of the onset of symptoms does not rule out     myocardial infarction with certainty.     If myocardial infarction is still suspected,     repeat the test at appropriate intervals.     CRITICAL RESULT CALLED TO, READ BACK BY AND VERIFIED WITH:     P SHELTON,RN 1620 06/15/2012 D BRADLEY  CBC WITH DIFFERENTIAL     Status: Abnormal   Collection Time    06/15/2012  2:25 PM      Result Value Range   WBC 18.8 (*) 4.0 - 10.5 K/uL   RBC 4.81  3.87 - 5.11 MIL/uL   Hemoglobin 10.8 (*) 12.0 - 15.0 g/dL   HCT 45.4 (*) 09.8 - 11.9 %   MCV 67.6 (*) 78.0 - 100.0 fL   MCH 22.5 (*) 26.0 - 34.0 pg   MCHC  33.2  30.0 - 36.0 g/dL   RDW 14.7  82.9 - 56.2 %   Platelets 186  150 - 400 K/uL   Neutrophils Relative % 87 (*) 43 - 77 %   Lymphocytes Relative 7 (*) 12 - 46 %   Monocytes Relative 6  3 - 12 %   Eosinophils Relative 0  0 - 5 %   Basophils Relative 0  0 - 1 %   Neutro Abs 16.4 (*) 1.7 - 7.7 K/uL   Lymphs Abs 1.3  0.7 - 4.0 K/uL   Monocytes Absolute 1.1 (*) 0.1 - 1.0 K/uL   Eosinophils Absolute 0.0  0.0 - 0.7 K/uL   Basophils Absolute 0.0  0.0 - 0.1 K/uL   RBC Morphology POLYCHROMASIA PRESENT     Smear Review LARGE PLATELETS PRESENT    PROTIME-INR     Status: None   Collection Time    06/19/2012  2:25 PM      Result Value Range   Prothrombin Time 14.6  11.6 - 15.2 seconds   INR 1.16  0.00 - 1.49  COMPREHENSIVE METABOLIC PANEL     Status: Abnormal   Collection Time    06/19/2012  2:25 PM      Result Value Range   Sodium 139  135 - 145 mEq/L   Potassium 3.1 (*) 3.5 - 5.1 mEq/L   Chloride 99  96 - 112 mEq/L   CO2 21  19 - 32 mEq/L   Glucose, Bld 91  70 - 99 mg/dL   BUN 53 (*) 6 - 23 mg/dL   Creatinine, Ser 1.30 (*) 0.50 - 1.10 mg/dL   Calcium 9.2  8.4 - 86.5 mg/dL   Total Protein 6.6  6.0 - 8.3 g/dL   Albumin 2.4 (*) 3.5 - 5.2 g/dL   AST 38 (*) 0 - 37 U/L   ALT 13  0 - 35 U/L   Alkaline Phosphatase 63  39 - 117 U/L   Total Bilirubin 0.2 (*) 0.3 - 1.2 mg/dL   GFR calc non Af Amer 8 (*) >90 mL/min   GFR calc Af Amer 10 (*) >90 mL/min   Comment:            The eGFR has been calculated     using the CKD EPI equation.     This calculation has not been  validated in all clinical     situations.     eGFR's persistently     <90 mL/min signify     possible Chronic Kidney Disease.  MAGNESIUM     Status: None   Collection Time    06/26/2012  2:25 PM      Result Value Range   Magnesium 2.0  1.5 - 2.5 mg/dL  PRO B NATRIURETIC PEPTIDE     Status: Abnormal   Collection Time    07/06/2012  2:25 PM      Result Value Range   Pro B Natriuretic peptide (BNP) 49043.0 (*) 0 - 125 pg/mL   HEPARIN LEVEL (UNFRACTIONATED)     Status: None   Collection Time    07/04/2012  2:25 PM      Result Value Range   Heparin Unfractionated 0.65  0.30 - 0.70 IU/mL   Comment:            IF HEPARIN RESULTS ARE BELOW     EXPECTED VALUES, AND PATIENT     DOSAGE HAS BEEN CONFIRMED,     SUGGEST FOLLOW UP TESTING     OF ANTITHROMBIN III LEVELS.  URINALYSIS, ROUTINE W REFLEX MICROSCOPIC     Status: Abnormal   Collection Time    07/09/2012  3:11 PM      Result Value Range   Color, Urine YELLOW  YELLOW   APPearance CLEAR  CLEAR   Specific Gravity, Urine 1.014  1.005 - 1.030   pH 5.5  5.0 - 8.0   Glucose, UA NEGATIVE  NEGATIVE mg/dL   Hgb urine dipstick TRACE (*) NEGATIVE   Bilirubin Urine NEGATIVE  NEGATIVE   Ketones, ur NEGATIVE  NEGATIVE mg/dL   Protein, ur 409 (*) NEGATIVE mg/dL   Urobilinogen, UA 0.2  0.0 - 1.0 mg/dL   Nitrite NEGATIVE  NEGATIVE   Leukocytes, UA TRACE (*) NEGATIVE  URINE MICROSCOPIC-ADD ON     Status: Abnormal   Collection Time    07/02/2012  3:11 PM      Result Value Range   Squamous Epithelial / LPF RARE  RARE   WBC, UA 0-2  <3 WBC/hpf   RBC / HPF 0-2  <3 RBC/hpf   Bacteria, UA RARE  RARE   Casts GRANULAR CAST (*) NEGATIVE   Urine-Other MUCOUS PRESENT     Comment: AMORPHOUS URATES/PHOSPHATES  SURGICAL PCR SCREEN     Status: None   Collection Time    06/17/2012  3:34 PM      Result Value Range   MRSA, PCR NEGATIVE  NEGATIVE   Staphylococcus aureus NEGATIVE  NEGATIVE   Comment:            The Xpert SA Assay (FDA     approved for NASAL specimens     in patients over 52 years of age),     is one component of     a comprehensive surveillance     program.  Test performance has     been validated by [employer] for patients greater     than or equal to 30 year old.     It is not intended     to diagnose infection nor to     guide or monitor treatment.  POCT I-STAT 3, BLOOD GAS (G3+)     Status: Abnormal   Collection Time    06/25/2012  4:03 PM      Result  Value Range   pH, Arterial 7.464 (*)  7.350 - 7.450   pCO2 arterial 27.8 (*) 35.0 - 45.0 mmHg   pO2, Arterial 79.0 (*) 80.0 - 100.0 mmHg   Bicarbonate 20.0  20.0 - 24.0 mEq/L   TCO2 21  0 - 100 mmol/L   O2 Saturation 96.0     Acid-base deficit 3.0 (*) 0.0 - 2.0 mmol/L   Patient temperature 98.6 F     Collection site RADIAL, ALLEN'S TEST ACCEPTABLE     Drawn by RT     Sample type ARTERIAL    GLUCOSE, CAPILLARY     Status: None   Collection Time    07/10/2012  4:10 PM      Result Value Range   Glucose-Capillary 89  70 - 99 mg/dL    Dg Chest Port 1 View  06/17/2012   *RADIOLOGY REPORT*  Clinical Data: Intubation, central line placement, hypertension, diabetes  PORTABLE CHEST - 1 VIEW  Comparison: None.  Findings: Endotracheal tube 6 cm above the carina.  Right IJ dialysis catheter tips in the SVC.  Left IJ central line tip in the mid SVC as well.  Normal heart size and vascularity.  Ill-defined increased opacity throughout the left central lung could represent developing airspace disease versus asymmetric edema.  Right lung relatively clear.  No effusion or pneumothorax. NG tube extends below the hemidiaphragms the tip not visualized.  IMPRESSION: Support apparatus in good position.  No pneumothorax  Ill-defined increased opacity left central lung could represent developing airspace process or asymmetric edema   Original Report Authenticated By: Judie Petit. Miles Costain, M.D.    Review of Systems  Unable to perform ROS: intubated   Blood pressure 138/70, pulse 88, resp. rate 18, height 5\' 6"  (1.676 m), weight 84.1 kg (185 lb 6.5 oz), SpO2 100.00%. Physical Exam  Nursing note and vitals reviewed. Constitutional: She appears well-developed and well-nourished.  Intubated- just back from cath lab- opens eyes to conversation  HENT:  Head: Normocephalic and atraumatic.  Nose: Nose normal.  Eyes: Conjunctivae are normal. Pupils are equal, round, and reactive to light.  Neck: Normal range of motion. Neck  supple. JVD present.  JVP 7-8cm  Cardiovascular: Normal rate, regular rhythm and normal heart sounds.  Exam reveals no friction rub.   No murmur heard. Respiratory:  On ventilator with mechanical/transmitted breath sounds  GI: Soft. Bowel sounds are normal. She exhibits no distension and no mass. There is no tenderness. There is no rebound.  Musculoskeletal: She exhibits no edema and no tenderness.  Neurological: She is alert.  Alert on vent and nods to questions  Skin: Skin is warm and dry. No rash noted. No erythema.    Assessment/Plan: 1. Chronic kidney disease stage V now with likely progression to end-stage renal disease following MI/contrast exposure. Evidence of moderate volume overload noted with the possibility of this having worsened her respiratory failure-intolerant to conventional hemodialysis at [facility] when tried. I will go head and start her on CR RT with the theoretical advantage of being less "stressful to her myocardium". The goal is trying to get some fluid off while providing her with clearance. Flushed dialyzer/system with 2 L of saline diminished any antigenic exposure-previous suspicion of dialyzer reaction. 2. Acute myocardial infarction: Status post repeat coronary angiography by cardiology-ongoing medical management as evaluation by CTS is awaited. 3. Anemia: Mild-monitor for possible packed red cells needs versus ESA. We'll send off iron studies. 4. Ventilator dependent respiratory failure: Suspected to be a combination of acute MI and pulmonary edema/volume overload-management per pulmonary and  will assist with volume removal.  Barbara Montoya K. 06/11/2012, 6:16 PM

## 2012-07-05 NOTE — Progress Notes (Signed)
I reviewed the initial  Echo at [facility] done yesterday with echo done here upon arrival. There is significant drop in EF with severe mid-distal anterior and apical hypokinesis. There is a high chance that the LAD is now occluded. Thus, I recommend urgent cath and possible PCI. I had a prolonged discussion with the patient's daughter about critical condition. Risks and benefits were discussed.  I consulted Dr. Allena Katz from Nephrology and spoke with him personally. The patient will need dialysis post cath likely CRRT. If LAD is found to be patent on cath, the drop in EF might be due to myocardial stunning from initiation of dialysis.   Lorine Bears, MD

## 2012-07-05 NOTE — Consult Note (Signed)
Patient examined, coronary arteriograms reviewed, 2-D echocardiogram reviewed.  67 year old diabetic female with recent severe subendocardial MI with troponin greater than 20 transferred from [facility] with acute on chronic renal failure requiring dialysis and severe LV dysfunction from recent MI.  She has a tunneled dialysis catheter located in her mid right anterior chest close to the midline.  Her chest x-ray shows severe pulmonary edema and she is sedated and intubated.  Her blood pressure is greater than 100 systolic and she may be in cardiogenic shock-co-ox and CVP measurements are pending.  Patient is not currently a candidate for surgical coronary revascularization.. Prior to surgery she will need establishment of successful hemodialysis. She would also need her hemodialysis access catheter revised so that it was not close to the sternal incision.  Her coronaries are graftable and the LAD and ramus circulation but not graftable in the distal RCA which is occluded or the distal circumflex which is small.  Recommend acute support for her severe multisystem failure with hopes for extubation. Definitive therapy of her CAD is not clear at this time as both PCI and CABG should be under consideration.

## 2012-07-05 NOTE — Progress Notes (Signed)
Utilization Review Completed.Dorcas Carrow T07-19-14

## 2012-07-05 NOTE — Progress Notes (Signed)
ABG results at 1613 ( ph 7.46, Pco2 27.8, Pao2 79) was drawn on rate of 20.  After aBG drawn, lowered set rate to 18 post ABG and per vent setting orders. RN aware

## 2012-07-05 NOTE — Consult Note (Addendum)
PULMONARY  / CRITICAL CARE MEDICINE  Name: Barbara Montoya MRN: [id number] DOB: 1945/04/29    ADMISSION DATE:  07/06/2012 CONSULTATION DATE:  07/06/2012  REFERRING MD :  Kirke Corin PRIMARY SERVICE: Cardiology  CHIEF COMPLAINT:  NSTEMI  BRIEF PATIENT DESCRIPTION: 67 year old female admitted 6/25 from [place] following NSTEMI. Now with respiratory failure and AKI needs CABG  SIGNIFICANT EVENTS / STUDIES:  6/23 ECHO >>> EF 40% 6/24 Cath >>> Mid LAD 99% stenosis, Ramus intermedius 90% stenosis, Proximal RCA 70% stenosis, Mid RCA 50% stenosis, RPDA 100% stenosis, RPLS 90% stenosis .................................................................................................................................. 6/25 Admit to [facility]  LINES / TUBES: ETT 8.0 6/25 >>> L IJ TLC ([facility]) >>> R [place] HD cath >>>  CULTURES: BC ([facility])>>> 6/25 UC>>>  ANTIBIOTICS:   HISTORY OF PRESENT ILLNESS:  Barbara Montoya is a 67 year old female who presented to the ED of [facility] on 6/23 with complaint of left arm pain and dyspnea on exertion. On further examination she noted that her arm pain had been ongoing for 2 days, she denied chest pain but noted that the dyspnea on exertion was getting worse and on the morning of 6/23 she started to have nausea with the left arm pain radiating to her jaw. She had a Troponin of 23 and her EKG revealed a known LBBB but significant ST depressions in V5 and V6 which were new.  She was admitted for an NSTEMI and the OSH decided to place a HD catheter and initiate hemodialysis due to her stage V CKD, they also performed a cardiac cath. She was initiated on HD on 6/24 and within 20 minutes of her start was in respiratory distress with sats of 50% on NRB. She was placed on Bipap with improvement of her sats. Today 6/25 they attempted dialysis again and she became hypoxic requiring intubation and mechanical ventilation.   With her cardiac cath showing significant  multi-vessel disease requiring a CABG decision was made to transfer her to [facility] for further workup and management.   We are consulted for ventilator management.    PAST MEDICAL HISTORY :  Past Medical History  Diagnosis Date  . Hypertension   . Diabetes mellitus   . H/O: CVA (cerebrovascular accident)   . CKD (chronic kidney disease), stage V   . Hyperlipidemia    No past surgical history on file. Prior to Admission medications   Medication Sig Start Date End Date Taking? Authorizing Provider  amLODipine (NORVASC) 10 MG tablet Take 10 mg by mouth daily.   Yes Historical Provider, MD  aspirin 325 MG tablet Take 325 mg by mouth daily.   Yes Historical Provider, MD  atorvastatin (LIPITOR) 40 MG tablet Take 40 mg by mouth daily.   Yes Historical Provider, MD  b complex-vitamin c-folic acid (NEPHRO-VITE) 0.8 MG TABS Take 0.8 mg by mouth at bedtime.   Yes Historical Provider, MD  carvedilol (COREG) 25 MG tablet Take 25 mg by mouth 2 (two) times daily with a meal.   Yes Historical Provider, MD  ferrous sulfate 325 (65 FE) MG tablet Take 325 mg by mouth daily with breakfast.   Yes Historical Provider, MD  furosemide (LASIX) 40 MG tablet Take 40 mg by mouth.   Yes Historical Provider, MD  glimepiride (AMARYL) 4 MG tablet Take 4 mg by mouth daily before breakfast.   Yes Historical Provider, MD  insulin glargine (LANTUS) 100 UNIT/ML injection Inject 12 Units into the skin at bedtime.    Historical Provider, MD  paricalcitol (  ZEMPLAR) 1 MCG capsule Take 1 mcg by mouth daily.    Historical Provider, MD  sodium bicarbonate 650 MG tablet Take 650 mg by mouth 4 (four) times daily.    Historical Provider, MD   Allergies no known allergies  FAMILY HISTORY:  No family history on file.  SOCIAL HISTORY:  has no tobacco, alcohol, and drug history on file.  REVIEW OF SYSTEMS:  Unable obtain  SUBJECTIVE:   Intubated and sedated  VITAL SIGNS: Pulse Rate:  [90-91] 91 (06/25 1400) Resp:  [20] 20  (06/25 1400) BP: (130-131)/(69-73) 130/73 mmHg (06/25 1400) SpO2:  [98 %-100 %] 100 % (06/25 1400) FiO2 (%):  [50 %] 50 % (06/25 1400) Weight:  [84.1 kg (185 lb 6.5 oz)] 84.1 kg (185 lb 6.5 oz) (06/25 1400)  HEMODYNAMICS:    VENTILATOR SETTINGS: Vent Mode:  [-]  FiO2 (%):  [50 %] 50 % Set Rate:  [20 bmp] 20 bmp Vt Set:  [500 mL] 500 mL PEEP:  [10 cmH20] 10 cmH20 Plateau Pressure:  [20 cmH20] 20 cmH20  INTAKE / OUTPUT: Intake/Output   None     PHYSICAL EXAMINATION: General:  Obese female who appears older than stated age Neuro:  Sedated. PERRL HEENT:  No scleral icterus Cardiovascular:  RRR, S1 and S2, no JVD, no carotid bruit, no murmur, no heave, no thrill Lungs:  CTA bilateral anterior Abdomen:  Round, soft, non-tender. Active BS Musculoskeletal:  MAE well and equal Skin:  Pink, warm, dry and intact  LABS: No results found for this basename: HGB, WBC, PLT, NA, K, CL, CO2, GLUCOSE, BUN, CREATININE, CALCIUM, MG, PHOS, AST, ALT, ALKPHOS, BILITOT, PROT, ALBUMIN, APTT, INR, LATICACIDVEN, TROPONINI, PROCALCITON, PROBNP, O2SATVEN, PHART, PCO2ART, PO2ART,  in the last 168 hours No results found for this basename: GLUCAP,  in the last 168 hours  CXR: ETT and lines are in acceptable position. Opacity in the left lung could represent edema vs infiltrate  ASSESSMENT / PLAN:  PULMONARY A:  Acute respiratory failure - due to pulmonary edema LL Opacity - edema vs infiltrate, volume up lends to toward edema P:   - Full vent support:  500 / 18 / 10 / 50% - assess ABG, wean PEEP / FiO2 after gas - PS trial in AM if meets criteria   CARDIOVASCULAR A:  NSTEMI Coronary Artery Disease multivessel disease evident on cath Hyperlipidemia chronic HTN  P:  - Cardiology to follow ACS - F/U ECHO - pending CVTS Eval for CABG - ASA, lipitor  RENAL A:   Stage V CKD baseline Cr 4.6 P:   - Follow nephrology recs - Will likely need to be on CRRT for volume removal - Avoid  nephrotoxic medications - Will need nephrology consult, will defer to primary.  GASTROINTESTINAL A:  No active issues P:   - NPO, but consult nutrition in AM for TF per nutrition. - Protonix for SUP  HEMATOLOGIC A:   Leukocytosis likely related to the MI Coagulopathy due to heparin infusion   Iron deficiency anemia chronic P:  - Continue ferrous sulfate - Follow CBC - Heparin per nomogram, no need for SCD's while on gtt  INFECTIOUS A:   Leukocytosis - likely secondary to acute stress response, MI.  No hx of infectious symptoms P:   - Follow WBC - BC pending from OSH - No indication for antibiotic therapy at this time - assess PCT  ENDOCRINE A:   Type 2 DM with hyperglycemia   P:   - CBG q4h -  SSI  NEUROLOGIC A:   Acute encephalopathy due to critical illness P:   - Fentanyl infusion for pain control - Propofol for vent synchrony - Goal RASS -1 - Daily awakening trial  TODAY'S SUMMARY: Mrs. Merlino presents following NSTEMI with significant CAD requiring CABG. She also has baseline CKD stage V. The most likely etiology for her respiratory failure is pulmonary edema likely precipitated by HD. She may require CRRT for volume removal. Once the edema resolves extubation can be considered. Will coordinate with cardiology and cardiac surgery for ACS management and plan for CABG.   [facility] [place] S-ACNP  Canary Brim, NP-C [facility] Pgr: [phone] or [phone]   I have personally obtained a history, examined the patient, evaluated laboratory and imaging results, formulated the assessment and plan and placed orders.  CRITICAL CARE: The patient is critically ill with multiple organ systems failure and requires high complexity decision making for assessment and support, frequent evaluation and titration of therapies, application of advanced monitoring technologies and extensive interpretation of multiple databases. Critical Care Time devoted to  patient care services described in this note is 45 minutes.   07-21-2012, 2:43 PM  Patient seen and examined, agree with above note.  I dictated the care and orders written for this patient under my direction.  Alyson Reedy, MD [phone]

## 2012-07-05 NOTE — Plan of Care (Signed)
Problem: Phase I Progression Outcomes Goal: Voiding-avoid urinary catheter unless indicated Outcome: Not Met (add Reason) foley     

## 2012-07-05 NOTE — H&P (Addendum)
The following is my original consult note done at [facility] on 07/04/2014: See below for update.   PATIENT NAME:  Barbara Montoya, Barbara Montoya MR#:  [id number] DATE OF BIRTH:  1945/10/28  DATE OF CONSULTATION:  07/03/2012  REQUESTING PHYSICIAN:  Enid Baas, MD CONSULTING PHYSICIAN:  Muhammad A. Kirke Corin, MD  PRIMARY CARE PHYSICIAN:  Beverely Risen, MD  REASON FOR CONSULTATION:  Non-ST elevation myocardial infarction.   HISTORY OF PRESENT ILLNESS:  This is a 67 year old female with known history of hypertension, type 2 diabetes, history of CVA, anemia of chronic kidney disease and stage V chronic kidney disease with a GFR of 10. She presented with left arm and shoulder discomfort radiating to her throat which started on Friday. This has been intermittent and worsening every night. She denies any chest discomfort. She did notice increased shortness of breath and mild orthopnea. She denies any similar symptoms. EKG showed left bundle branch block which was present on a previous EKG in 2012. Troponin was found to be 23.   PAST MEDICAL HISTORY:   1.  Hypertension.  2.  Type 2 diabetes.  3.  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  4.  Anemia of chronic disease.  5.  Stage V chronic kidney disease.  6.  Hyperlipidemia.   ALLERGIES:  No known drug allergies.   HOME MEDICATIONS:   1.  Amlodipine 10 mg daily.  2.  Aspirin 325 mg once daily.  3.  Atorvastatin 40 mg daily.  4.  Coreg 25 mg twice daily.  5.  Ferrous sulfate 325 mg once daily.  6.  Glimepiride 4 mg once daily.  7.  Lasix 40 mg once daily.  8.  Multivitamin once a day.  9.  Sodium bicarbonate 650 mg twice daily.  10.  Zemplar 1 mcg once a day.  11.  Insulin Lantus.   SOCIAL HISTORY:  She lives at home with her mother. She denies any alcohol, smoking or recreational drug use.   FAMILY HISTORY:  Her father died of a myocardial infarction in his 2s.   REVIEW OF SYSTEMS:  A 10-point review of systems was performed. It is  negative other than what is mentioned in the HPI.   PHYSICAL EXAMINATION: GENERAL: The patient appears to be at her stated age in no acute distress.  VITAL SIGNS: Temperature 99.6, pulse 75, respiratory rate 20, blood pressure 129/83 and oxygen saturation is 96% on room air.  HEENT: Normocephalic, atraumatic.  NECK: No JVD or carotid bruits.  RESPIRATORY: Normal respiratory effort with no use of accessory muscles. Auscultation reveals a few crackles at the base.  CARDIOVASCULAR: Normal PMI. Normal S1 and S2 with no gallops or murmurs.  ABDOMEN: Benign, nontender and nondistended.  EXTREMITIES: No clubbing, cyanosis or edema.  SKIN: Warm and dry with no rash.  PSYCHIATRIC: She is alert, oriented x 3 with normal mood and affect.   LABORATORY AND DIAGNOSTIC DATA:  Labs showed a creatinine of 4.8. Troponin is 23. White cell count is 16,000 and hemoglobin of 11. EKG showed left bundle branch block which is not new.   IMPRESSION:   1.  Non-ST elevation myocardial infarction with late presentation.  2.  Advanced chronic kidney disease predialysis.  3.  Hypertension.  4.  Type 2 diabetes.   RECOMMENDATIONS:  The patient presents with non-ST elevation myocardial infarction or late presentation ST elevation myocardial infarction with an underlying left bundle branch block which makes diagnosis of ST elevation difficult. Currently, she is not having  any ischemic symptoms. She was already started on unfractionated heparin and aspirin. Her blood pressure is well controlled on current medications. She is already on a statin. I recommend continuing current medications. The patient has advanced chronic kidney disease, but the plan was to initiate the dialysis in the near future. I discussed with her management options and recommend proceeding with cardiac catheterization and possible coronary intervention. I discussed the risks and benefits. Obviously, the main issue here is that her kidney function will  likely worsen with contrast use which will likely push her to needing dialysis. We will obtain a nephrology consultation. I will also obtain an echocardiogram in the morning to evaluate LV systolic function and try to minimize contrast use.    ____________________________ Chelsea Aus. Kirke Corin, MD  Update on 2012/07/13:  Echo showed an EF of 40-45% with anterior wall hypokinesis. Cardiac cath done on 6/24 at [facility] showed severe 3 vessel CAD: LM: 30%, LAD: 99% mid (likely the culprit) , Ramus: 90% proximal with occluded RPDA.  Dialysis was planned after cath. However, she became hypoxic during dialysis which was aborted. Again dialysis was attempted this am but she had worsening respiratory status. She was suspected of having dialysis membrane reaction according to Dr. Cherylann Ratel. She was intubated upon arrival to [facility].  Unfortunately, she has old LBBB. Thus, difficult to know if there is a new ischemic event.  Plan: Stat labs including cardiac enzymes. Stat Echo.  Continue Aspirin, Statin and Heparin drip. CTS surgery consulted but we need to stabilize the patient.  Dr. Cherylann Ratel discussed the case with Dr. Briant Cedar from nephrology. She will need dialysis.

## 2012-07-06 ENCOUNTER — Inpatient Hospital Stay (HOSPITAL_COMMUNITY): Payer: Medicare Other

## 2012-07-06 ENCOUNTER — Encounter (HOSPITAL_COMMUNITY)
Admission: AD | Disposition: E | Payer: Self-pay | Source: Other Acute Inpatient Hospital | Attending: Cardiovascular Disease

## 2012-07-06 DIAGNOSIS — I959 Hypotension, unspecified: Secondary | ICD-10-CM

## 2012-07-06 DIAGNOSIS — N185 Chronic kidney disease, stage 5: Secondary | ICD-10-CM

## 2012-07-06 DIAGNOSIS — I2589 Other forms of chronic ischemic heart disease: Secondary | ICD-10-CM

## 2012-07-06 DIAGNOSIS — I2510 Atherosclerotic heart disease of native coronary artery without angina pectoris: Secondary | ICD-10-CM

## 2012-07-06 DIAGNOSIS — I255 Ischemic cardiomyopathy: Secondary | ICD-10-CM | POA: Diagnosis present

## 2012-07-06 LAB — CARBOXYHEMOGLOBIN
Carboxyhemoglobin: 1 % (ref 0.5–1.5)
Methemoglobin: 1.4 % (ref 0.0–1.5)
O2 Saturation: 58.2 %
Total hemoglobin: 10.4 g/dL — ABNORMAL LOW (ref 12.0–16.0)

## 2012-07-06 LAB — IRON AND TIBC
Saturation Ratios: 18 % — ABNORMAL LOW (ref 20–55)
TIBC: 119 ug/dL — ABNORMAL LOW (ref 250–470)

## 2012-07-06 LAB — CBC
HCT: 30.8 % — ABNORMAL LOW (ref 36.0–46.0)
HCT: 30.8 % — ABNORMAL LOW (ref 36.0–46.0)
Hemoglobin: 10.2 g/dL — ABNORMAL LOW (ref 12.0–15.0)
MCH: 22.6 pg — ABNORMAL LOW (ref 26.0–34.0)
MCH: 22.3 pg — ABNORMAL LOW (ref 26.0–34.0)
MCHC: 33.1 g/dL (ref 30.0–36.0)
MCV: 68.1 fL — ABNORMAL LOW (ref 78.0–100.0)
MCV: 67.4 fL — ABNORMAL LOW (ref 78.0–100.0)
Platelets: 156 10*3/uL (ref 150–400)
RBC: 4.57 MIL/uL (ref 3.87–5.11)
RDW: 15 % (ref 11.5–15.5)
RDW: 14.9 % (ref 11.5–15.5)
WBC: 16.9 10*3/uL — ABNORMAL HIGH (ref 4.0–10.5)

## 2012-07-06 LAB — TROPONIN I: Troponin I: 8.96 ng/mL (ref <0.30)

## 2012-07-06 LAB — BASIC METABOLIC PANEL
BUN: 35 mg/dL — ABNORMAL HIGH (ref 6–23)
Chloride: 99 mEq/L (ref 96–112)
Creatinine, Ser: 2.87 mg/dL — ABNORMAL HIGH (ref 0.50–1.10)
Glucose, Bld: 201 mg/dL — ABNORMAL HIGH (ref 70–99)
Potassium: 3.7 mEq/L (ref 3.5–5.1)

## 2012-07-06 LAB — URINE CULTURE
Colony Count: NO GROWTH
Culture: NO GROWTH

## 2012-07-06 LAB — RENAL FUNCTION PANEL
BUN: 24 mg/dL — ABNORMAL HIGH (ref 6–23)
CO2: 23 mEq/L (ref 19–32)
Calcium: 8.2 mg/dL — ABNORMAL LOW (ref 8.4–10.5)
Creatinine, Ser: 2.12 mg/dL — ABNORMAL HIGH (ref 0.50–1.10)
GFR calc Af Amer: 27 mL/min — ABNORMAL LOW (ref >90)
Glucose, Bld: 146 mg/dL — ABNORMAL HIGH (ref 70–99)

## 2012-07-06 LAB — GLUCOSE, CAPILLARY
Glucose-Capillary: 105 mg/dL — ABNORMAL HIGH (ref 70–99)
Glucose-Capillary: 199 mg/dL — ABNORMAL HIGH (ref 70–99)

## 2012-07-06 LAB — FERRITIN: Ferritin: 1139 ng/mL — ABNORMAL HIGH (ref 10–291)

## 2012-07-06 LAB — PROCALCITONIN: Procalcitonin: 3.22 ng/mL

## 2012-07-06 SURGERY — CORONARY ARTERY BYPASS GRAFTING (CABG)
Anesthesia: General | Site: Chest

## 2012-07-06 MED ORDER — DOBUTAMINE IN D5W 4-5 MG/ML-% IV SOLN
2.5000 ug/kg/min | INTRAVENOUS | Status: DC
  Administered 2012-07-06: 2.5 ug/kg/min via INTRAVENOUS
  Filled 2012-07-06: qty 250

## 2012-07-06 MED ORDER — OSMOLITE 1.2 CAL PO LIQD
1000.0000 mL | ORAL | Status: DC
  Administered 2012-07-06: 1000 mL
  Filled 2012-07-06 (×3): qty 1000

## 2012-07-06 MED ORDER — SODIUM CHLORIDE 0.9 % IV SOLN
INTRAVENOUS | Status: DC
  Administered 2012-07-12: 06:00:00 via INTRAVENOUS

## 2012-07-06 MED ORDER — NOREPINEPHRINE BITARTRATE 1 MG/ML IJ SOLN
2.0000 ug/min | INTRAVENOUS | Status: DC
  Administered 2012-07-06: 2 ug/min via INTRAVENOUS
  Administered 2012-07-08: 6 ug/min via INTRAVENOUS
  Administered 2012-07-09: 23 ug/min via INTRAVENOUS
  Administered 2012-07-10: 20 ug/min via INTRAVENOUS
  Administered 2012-07-10: 23 ug/min via INTRAVENOUS
  Administered 2012-07-11: 6 ug/min via INTRAVENOUS
  Administered 2012-07-12: 25 ug/min via INTRAVENOUS
  Administered 2012-07-12: 10 ug/min via INTRAVENOUS
  Administered 2012-07-13 – 2012-07-14 (×3): 16 ug/min via INTRAVENOUS
  Administered 2012-07-14: 25 ug/min via INTRAVENOUS
  Administered 2012-07-15: 26 ug/min via INTRAVENOUS
  Filled 2012-07-06 (×15): qty 16

## 2012-07-06 MED ORDER — MIDAZOLAM HCL 2 MG/2ML IJ SOLN
1.0000 mg | INTRAMUSCULAR | Status: DC | PRN
  Administered 2012-07-06 (×3): 1 mg via INTRAVENOUS
  Administered 2012-07-07: 2 mg via INTRAVENOUS
  Filled 2012-07-06 (×4): qty 2

## 2012-07-06 NOTE — Progress Notes (Signed)
Procedure(s) (LRB): CORONARY ARTERY BYPASS GRAFTING (CABG) (N/A) INTRAOPERATIVE TRANSESOPHAGEAL ECHOCARDIOGRAM (N/A) Subjective: Patient remains intubated on CVVH with norepinephrine dobutamine drips. Co-ox morning 58%. 2-D echo 24 hours ago showed severe LV dysfunction EF around 20%. Right heart cath not performed at the time of coronary arteriogram.  The patient has significant LAD and ramus disease--the RCA is chronically occluded and the distal vessel is a poor target.  The patient is not currently a candidate for multivessel bypass grafting. Would consider PCI of the LAD and ramus especially now that it appears the patient will be a long-term dialysis patient. Objective: Vital signs in last 24 hours: Temp:  [92.2 F (33.4 C)-99 F (37.2 C)] 92.8 F (33.8 C) (06/26 1200) Pulse Rate:  [56-91] 68 (06/26 1230) Cardiac Rhythm:  [-] Normal sinus rhythm;Sinus bradycardia (06/26 1200) Resp:  [0-22] 18 (06/26 1230) BP: (80-166)/(43-84) 92/67 mmHg (06/26 1230) SpO2:  [98 %-100 %] 100 % (06/26 1230) FiO2 (%):  [40 %-50 %] 40 % (06/26 1221) Weight:  [182 lb 5.1 oz (82.7 kg)] 182 lb 5.1 oz (82.7 kg) (06/26 0500)  Hemodynamic parameters for last 24 hours: CVP:  [4 mmHg-6 mmHg] 6 mmHg  Intake/Output from previous day: 06/25 0701 - 06/26 0700 In: 1060.5 [I.V.:1010.5; NG/GT:50] Out: 1695 [Urine:300] Intake/Output this shift: Total I/O In: 428.4 [I.V.:428.4] Out: 793 [Urine:28; Other:765]  Exam Sedated on vent Cool distal perfusion Core temperature only 93 Scattered rales  Lab Results:  Recent Labs  07/10/2012 0500 06/29/2012 0800  WBC 15.9* 16.9*  HGB 10.2* 10.2*  HCT 30.8* 30.8*  PLT 163 156   BMET:  Recent Labs  06/23/2012 1425 07/10/2012 0500  NA 139 136  K 3.1* 3.7  CL 99 99  CO2 21 24  GLUCOSE 91 201*  BUN 53* 35*  CREATININE 4.90* 2.87*  CALCIUM 9.2 7.5*    PT/INR:  Recent Labs  07/10/2012 1425  LABPROT 14.6  INR 1.16   ABG    Component Value Date/Time    PHART 7.464* 06/26/2012 1603   HCO3 20.0 06/11/2012 1603   TCO2 21 06/25/2012 1603   ACIDBASEDEF 3.0* 06/22/2012 1603   O2SAT 58.2 06/29/2012 0341   CBG (last 3)   Recent Labs  06/18/2012 0445 07/07/2012 0753 06/24/2012 1216  GLUCAP 199* 205* 82    Assessment/Plan: S/P Procedure(s) (LRB): CORONARY ARTERY BYPASS GRAFTING (CABG) (N/A) INTRAOPERATIVE TRANSESOPHAGEAL ECHOCARDIOGRAM (N/A) We'll follow patient but not currently candidate for multivessel CABG--consider PCI.   LOS: 1 day    VAN TRIGT III,Teandra Harlan 07/09/2012

## 2012-07-06 NOTE — Progress Notes (Signed)
1. Chronic kidney disease stage V now with likely progression to end-stage renal disease following MI/contrast exposure.  2. Acute myocardial infarction:   3. Anemia: 4. Ventilator dependent respiratory failure  Plan: Cont CVVHD, reduce Ultrafiltration goal since CVP only 6 cm and BP low  Subjective: Pressor dependent   Objective: Vital signs in last 24 hours: Temp:  [98 F (36.7 Montoya)-99 F (37.2 Montoya)] 98 F (36.7 Montoya) (06/26 0800) Pulse Rate:  [57-91] 58 (06/26 0800) Resp:  [0-26] 14 (06/26 0800) BP: (80-166)/(48-84) 166/58 mmHg (06/26 0800) SpO2:  [95 %-100 %] 100 % (06/26 0800) FiO2 (%):  [40 %-50 %] 40 % (06/26 0800) Weight:  [82.7 kg (182 lb 5.1 oz)-84.1 kg (185 lb 6.5 oz)] 82.7 kg (182 lb 5.1 oz) (06/26 0500) Weight change:   Intake/Output from previous day: 06/25 0701 - 06/26 0700 In: 1060.5 [I.V.:1010.5; NG/GT:50] Out: 1695 [Urine:300] Intake/Output this shift: Total I/O In: 65.5 [I.V.:65.5] Out: 136 [Other:136] General appearance: sedated Resp: clear to auscultation bilaterally Chest wall: no tenderness GI: soft Extremities: edema tr Lab Results:  Recent Labs  07/07/2012 0500 07/04/2012 0800  WBC 15.9* 16.9*  HGB 10.2* 10.2*  HCT 30.8* 30.8*  PLT 163 156   BMET:  Recent Labs  06/24/2012 1425 07/02/2012 0500  NA 139 136  K 3.1* 3.7  CL 99 99  CO2 21 24  GLUCOSE 91 201*  BUN 53* 35*  CREATININE 4.90* 2.87*  CALCIUM 9.2 7.5*   No results found for this basename: PTH,  in the last 72 hours Iron Studies:  Recent Labs  06/15/2012 1900  IRON 22*  TIBC 119*  FERRITIN 1139*   Studies/Results: Dg Chest Port 1 View  06/13/2012   *RADIOLOGY REPORT*  Clinical Data: Check ET tube position.Respiratory failure.  PORTABLE CHEST - 1 VIEW  Comparison: 06/27/2012  Findings: Support devices are unchanged.  Bibasilar atelectasis or infiltrates, similar to prior study.  Improving aeration in the left upper lobe.  Suspect trace effusions.  Heart is normal size.  IMPRESSION:  Improving aeration in the left upper lobe.  Bibasilar atelectasis, trace effusions.   Original Report Authenticated By: Charlett Nose, M.D.   Dg Chest Port 1 View  06/19/2012   *RADIOLOGY REPORT*  Clinical Data: Intubation, central line placement, hypertension, diabetes  PORTABLE CHEST - 1 VIEW  Comparison: None.  Findings: Endotracheal tube 6 cm above the carina.  Right IJ dialysis catheter tips in the SVC.  Left IJ central line tip in the mid SVC as well.  Normal heart size and vascularity.  Ill-defined increased opacity throughout the left central lung could represent developing airspace disease versus asymmetric edema.  Right lung relatively clear.  No effusion or pneumothorax. NG tube extends below the hemidiaphragms the tip not visualized.  IMPRESSION: Support apparatus in good position.  No pneumothorax  Ill-defined increased opacity left central lung could represent developing airspace process or asymmetric edema   Original Report Authenticated By: Judie Petit. Shick, M.D.   Scheduled: . antiseptic oral rinse  15 mL Mouth Rinse Q4H  . aspirin  325 mg Oral Daily  . atorvastatin  80 mg Oral q1800  . chlorhexidine  15 mL Mouth/Throat BID  . ferrous sulfate  325 mg Oral Q breakfast  . insulin aspart  2-6 Units Subcutaneous Q4H  . multivitamin  1 tablet Oral Daily  . pantoprazole (PROTONIX) IV  40 mg Intravenous QHS  . paricalcitol  1 mcg Oral Daily  . sodium bicarbonate  650 mg Oral QID  LOS: 1 day   Barbara Montoya 06/20/2012,8:42 AM

## 2012-07-06 NOTE — Progress Notes (Addendum)
INITIAL NUTRITION ASSESSMENT  DOCUMENTATION CODES Per approved criteria  -Not Applicable   INTERVENTION: 1.  Enteral nutrition;  MD authorizes RD to order TF for pt. Initiate Osmolite 1.2 @ 20 mL/hr continuous.  Advance by 10 mL q 4 hrs to 30 mL/hr goal with Prostat 5 times daily to provide 1693 kcal, 114g protein, and 590 mL free water with current propofol use.   NUTRITION DIAGNOSIS: Inadequate oral intake related to inability to eat as evidenced by mechanical ventilation.   Goal: Pt meeting >/=90% estimated needs   Monitor:  Vent status, enteral initiation, wt, I/Os  Reason for Assessment: vent  67 y.o. female  Admitting Dx: Myocardial infarction  ASSESSMENT: Pt admitted following NSTEMI, now with respiratory failure and worsening CKD.  Pt requiring CVVHD. Patient is currently intubated on ventilator support.  MV: 9.3 L/min Temp:Temp (24hrs), Avg:96 F (35.6 C), Min:92.2 F (33.4 C), Max:99 F (37.2 C)  Propofol: 12.6 ml/hr providing 332 kcal/day   Height: Ht Readings from Last 1 Encounters:  06/11/2012 5\' 6"  (1.676 m)    Weight: Wt Readings from Last 1 Encounters:  06/15/2012 182 lb 5.1 oz (82.7 kg)    Ideal Body Weight: 130 lbs  % Ideal Body Weight: 140%  Wt Readings from Last 10 Encounters:  07/01/2012 182 lb 5.1 oz (82.7 kg)  06/25/2012 182 lb 5.1 oz (82.7 kg)  07/01/2012 182 lb 5.1 oz (82.7 kg)    Usual Body Weight: unknown  % Usual Body Weight: unknown  BMI:  Body mass index is 29.44 kg/(m^2).  Estimated Nutritional Needs: Kcal: 1612 Protein: 100-120g Fluid: >2.0 L/day  Skin: intact  Diet Order:    EDUCATION NEEDS: -Education not appropriate at this time   Intake/Output Summary (Last 24 hours) at 06/26/2012 1212 Last data filed at 06/18/2012 1200  Gross per 24 hour  Intake   1354 ml  Output   2294 ml  Net   -940 ml    Last BM: PTA  Labs:   Recent Labs Lab 06/11/2012 1425 06/22/2012 0500  NA 139 136  K 3.1* 3.7  CL 99 99  CO2 21 24   BUN 53* 35*  CREATININE 4.90* 2.87*  CALCIUM 9.2 7.5*  MG 2.0  --   GLUCOSE 91 201*    CBG (last 3)   Recent Labs  06/14/2012 0026 07/07/2012 0445 06/19/2012 0753  GLUCAP 105* 199* 205*    Scheduled Meds: . antiseptic oral rinse  15 mL Mouth Rinse Q4H  . aspirin  325 mg Oral Daily  . atorvastatin  80 mg Oral q1800  . chlorhexidine  15 mL Mouth/Throat BID  . ferrous sulfate  325 mg Oral Q breakfast  . insulin aspart  2-6 Units Subcutaneous Q4H  . multivitamin  1 tablet Oral Daily  . pantoprazole (PROTONIX) IV  40 mg Intravenous QHS  . paricalcitol  1 mcg Oral Daily    Continuous Infusions: . sodium chloride 10 mL/hr at 06/18/2012 0800  . sodium chloride 10 mL/hr (06/24/2012 0800)  . dextrose 20 mL/hr at 06/22/2012 0800  . DOBUTamine 2.5 mcg/kg/min (06/15/2012 1048)  . fentaNYL infusion INTRAVENOUS 100 mcg/hr (07/03/2012 1128)  . heparin 850 Units/hr (06/23/2012 0800)  . norepinephrine (LEVOPHED) Adult infusion 5 mcg/min (06/18/2012 1200)  . dialysis replacement fluid (prismasate) 400 mL/hr at 07/07/2012 0922  . dialysis replacement fluid (prismasate) 200 mL/hr at 07/04/2012 2008  . dialysate (PRISMASATE) 1,500 mL/hr at 06/19/2012 1032    Past Medical History  Diagnosis Date  . Hypertension   .  Diabetes mellitus   . H/O: CVA (cerebrovascular accident)   . CKD (chronic kidney disease), stage V   . Hyperlipidemia     History reviewed. No pertinent past surgical history.  Loyce Dys, MS RD LDN Clinical Inpatient Dietitian Pager: [phone] Weekend/After hours pager: [phone]

## 2012-07-06 NOTE — Progress Notes (Addendum)
PULMONARY  / CRITICAL CARE MEDICINE  Name: Barbara Montoya MRN: [id number] DOB: 1945/08/19    ADMISSION DATE:  06/29/2012 CONSULTATION DATE:  07/06/2012  REFERRING MD :  Kirke Corin PRIMARY SERVICE: Cardiology  CHIEF COMPLAINT:  NSTEMI  BRIEF PATIENT DESCRIPTION: 67 year old female admitted 6/25 from [place] following NSTEMI. Now with respiratory failure and AKI, needs CABG  SIGNIFICANT EVENTS / STUDIES:  6/23 ECHO >>> EF 40% 6/24 Cath >>> Mid LAD 99% stenosis, Ramus intermedius 90% stenosis, Proximal RCA 70% stenosis, Mid RCA 50% stenosis, RPDA 100% stenosis, RPLS 90% stenosis .................................................................................................................................. 6/25 Admit to [facility] 6/25 TTE >> dilated LV, EF 25%, focal hypokinesis  LINES / TUBES: ETT 8.0 6/25 >>> L IJ TLC ([facility]) >>> R [place] HD cath >>>  CULTURES: BC ([facility])>>> 6/25 UC>>>  ANTIBIOTICS:  HISTORY OF PRESENT ILLNESS:  Barbara Montoya is a 67 year old female [facility] with complaint of left arm pain and dyspnea on exertion. On further examination she noted that her arm pain had been ongoing for 2 days, she denied chest pain but noted that the dyspnea on exertion was getting worse and on the morning of 6/23 she started to have nausea with the left arm pain radiating to her jaw. She had a Troponin of 23 and her EKG revealed a known LBBB but significant ST depressions in V5 and V6 which were new.  She was admitted for an NSTEMI and the OSH decided to place a HD catheter and initiate hemodialysis due to her stage V CKD, they also performed a cardiac cath. She was initiated on HD on 6/24 and within 20 minutes of her start was in respiratory distress with sats of 50% on NRB. She was placed on Bipap with improvement of her sats. Today 6/25 they attempted dialysis again and she became hypoxic requiring intubation and mechanical ventilation.    With her cardiac cath showing significant multi-vessel disease requiring a CABG decision was made to transfer her to [facility] for further workup and management.    SUBJECTIVE / Interval events: Intubated and sedated  VITAL SIGNS: Temp:  [98 F (36.7 C)-99 F (37.2 C)] 98 F (36.7 C) (06/26 0800) Pulse Rate:  [56-91] 58 (06/26 0930) Resp:  [0-26] 16 (06/26 0930) BP: (80-166)/(43-84) 108/59 mmHg (06/26 0930) SpO2:  [95 %-100 %] 100 % (06/26 0930) FiO2 (%):  [40 %-50 %] 40 % (06/26 0800) Weight:  [82.7 kg (182 lb 5.1 oz)-84.1 kg (185 lb 6.5 oz)] 82.7 kg (182 lb 5.1 oz) (06/26 0500)  HEMODYNAMICS: CVP:  [4 mmHg-6 mmHg] 6 mmHg  VENTILATOR SETTINGS: Vent Mode:  [-] PRVC FiO2 (%):  [40 %-50 %] 40 % Set Rate:  [18 bmp-20 bmp] 18 bmp Vt Set:  [500 mL] 500 mL PEEP:  [10 cmH20] 10 cmH20 Plateau Pressure:  [20 cmH20-30 cmH20] 28 cmH20  INTAKE / OUTPUT: Intake/Output     06/25 0701 - 06/26 0700 06/26 0701 - 06/27 0700   I.V. (mL/kg) 1010.5 (12.2) 105.5 (1.3)   NG/GT 50    Total Intake(mL/kg) 1060.5 (12.8) 105.5 (1.3)   Urine (mL/kg/hr) 300    Other 1395 266 (1.2)   Total Output 1695 266   Net -634.5 -160.5          PHYSICAL EXAMINATION: General:  Obese female who appears older than stated age Neuro:  Sedated. PERRL HEENT:  No scleral icterus Cardiovascular:  RRR, S1 and S2, no JVD, no carotid bruit, no murmur,  no heave, no thrill Lungs:  CTA bilateral anterior Abdomen:  Round, soft, non-tender. Active BS Musculoskeletal:  MAE well and equal Skin:  Pink, warm, dry and intact  LABS:  Recent Labs Lab 08/01/12 1425 08/01/2012 1603 Aug 01, 2012 1952 06/28/2012 0152 06/17/2012 0500 06/16/2012 0800  HGB 10.8*  --   --   --  10.2* 10.2*  WBC 18.8*  --   --   --  15.9* 16.9*  PLT 186  --   --   --  163 156  NA 139  --   --   --  136  --   K 3.1*  --   --   --  3.7  --   CL 99  --   --   --  99  --   CO2 21  --   --   --  24  --   GLUCOSE 91  --   --   --  201*  --   BUN 53*  --    --   --  35*  --   CREATININE 4.90*  --   --   --  2.87*  --   CALCIUM 9.2  --   --   --  7.5*  --   MG 2.0  --   --   --   --   --   AST 38*  --   --   --   --   --   ALT 13  --   --   --   --   --   ALKPHOS 63  --   --   --   --   --   BILITOT 0.2*  --   --   --   --   --   PROT 6.6  --   --   --   --   --   ALBUMIN 2.4*  --   --   --   --   --   INR 1.16  --   --   --   --   --   TROPONINI 9.19*  --  10.83* 8.96*  --   --   PROCALCITON 5.33  --   --   --  3.22  --   PROBNP 49043.0*  --   --   --   --   --   PHART  --  7.464*  --   --   --   --   PCO2ART  --  27.8*  --   --   --   --   PO2ART  --  79.0*  --   --   --   --     Recent Labs Lab 2012/08/01 1957 08/01/2012 2344 06/21/2012 0026 07/05/2012 0445 06/27/2012 0753  GLUCAP 105* 68* 105* 199* 205*    CXR: 6/26 > ETT and lines are in acceptable position. Improving LUL and B infiltrates  ASSESSMENT / PLAN:  PULMONARY A:  Acute respiratory failure - due to pulmonary edema LL Opacity - edema vs infiltrate, volume up lends to toward edema, improved 6/26 P:   - decrease PEEP to 5 - follow CXR and ABG - PSV 6/26 or 27   CARDIOVASCULAR A:  NSTEMI Shock, presumed cardiogenic; LVEF 25%, dilated LV Coronary Artery Disease multivessel disease evident on cath, no change on repeat at [facility] 6/25 Hyperlipidemia chronic Hx HTN  P:  - Cardiology to follow ACS - wean norepi as able, consider dopa/dobuta  if pressor needs continue - pending CVTS Eval for CABG - ASA, lipitor  RENAL A:   Stage V CKD baseline Cr 4.6 P:   - CRRT for volume removal - Avoid nephrotoxic medications  GASTROINTESTINAL A:  No active issues P:   - start nutrition  - Protonix for SUP  HEMATOLOGIC A:   Leukocytosis likely related to the MI Coagulopathy due to heparin infusion   Iron deficiency anemia chronic P:  - Continue ferrous sulfate - Follow CBC - Heparin, no need for SCD's while on gtt  INFECTIOUS A:   Leukocytosis - likely secondary  to acute stress response, MI.  No hx of infectious symptoms, Pct 3.22 P:   - Follow WBC - BC pending from OSH - No indication for antibiotic therapy at this time  ENDOCRINE A:   Type 2 DM with hyperglycemia   P:   - CBG q4h - SSI  NEUROLOGIC A:   Acute encephalopathy due to critical illness P:   - Fentanyl infusion for pain control - change propofol to versed prn if possible - Goal RASS -1 - Daily awakening trial  TODAY'S SUMMARY: Mrs. Gratz presents following NSTEMI with significant CAD requiring CABG. She also has baseline CKD stage V. The most likely etiology for her respiratory failure is pulmonary edema. On CRRT for volume removal. Once the edema resolves extubation can be considered. Will coordinate with cardiology and cardiac surgery for ACS management and plan for CABG.    I have personally obtained a history, examined the patient, evaluated laboratory and imaging results, formulated the assessment and plan and placed orders.  CRITICAL CARE: The patient is critically ill with multiple organ systems failure and requires high complexity decision making for assessment and support, frequent evaluation and titration of therapies, application of advanced monitoring technologies and extensive interpretation of multiple databases. Critical Care Time devoted to patient care services described in this note is 45 minutes.     Levy Pupa, MD, PhD 06/15/2012, 9:45 AM [facility] [phone] or if no answer [phone]

## 2012-07-06 NOTE — Progress Notes (Signed)
Patient Name: Barbara Montoya Date of Encounter: 06/14/2012  Principal Problem:   Myocardial infarction Active Problems:   Hypertension   Diabetes mellitus   CKD (chronic kidney disease), stage V   Acute respiratory failure   Acute pulmonary edema   Altered mental status    SUBJECTIVE: Intubated, responds to pain, on sedation. CVVH going on.  OBJECTIVE Filed Vitals:   07/02/2012 0545 07/10/2012 0600 06/20/2012 0615 06/28/2012 0630  BP: 105/67 98/65 102/58 97/80  Pulse: 58 58 59 58  Temp:      TempSrc:      Resp: 18 18 18 18   CVP    4  Weight:      SpO2: 100% 100% 100% 100%    Intake/Output Summary (Last 24 hours) at 07/05/2012 0641 Last data filed at 07/07/2012 0600  Gross per 24 hour  Intake 985.01 ml  Output   1576 ml  Net -590.99 ml   Filed Weights   06/29/2012 1400 06/20/2012 0500  Weight: 185 lb 6.5 oz (84.1 kg) 182 lb 5.1 oz (82.7 kg)    PHYSICAL EXAM General: Well developed, well nourished, female  Sedated on the vent. Head: Normocephalic, atraumatic.  Neck: Supple without bruits, JVD not elevated, difficult to assess secondary to equipment. Lungs:  Resp regular and unlabored, CTA anteriorly. Heart: RRR, S1, S2, no S3, S4, or murmur; no rub. Abdomen: Soft, non-tender, non-distended, BS + x 4.  Extremities: No clubbing, cyanosis, no edema. Capillary refill slightly delayed    LABS: ABG    Component Value Date/Time   PHART 7.464* 06/16/2012 1603   PCO2ART 27.8* 07/03/2012 1603   PO2ART 79.0* 06/19/2012 1603   HCO3 20.0 06/21/2012 1603   TCO2 21 06/14/2012 1603   ACIDBASEDEF 3.0* 06/22/2012 1603   O2SAT 58.2 06/28/2012 0341   CBC: Recent Labs  07/10/2012 1425 06/11/2012 0500  WBC 18.8* 15.9*  NEUTROABS 16.4*  --   HGB 10.8* 10.2*  HCT 32.5* 30.8*  MCV 67.6* 68.1*  PLT 186 163   INR: Recent Labs  06/20/2012 1425  INR 1.16   Basic Metabolic Panel: Recent Labs  07/07/2012 1425 07/02/2012 0500  NA 139 136  K 3.1* 3.7  CL 99 99  CO2 21 24  GLUCOSE 91 201*    BUN 53* 35*  CREATININE 4.90* 2.87*  CALCIUM 9.2 7.5*  MG 2.0  --    Liver Function Tests: Recent Labs  07/07/2012 1425  AST 38*  ALT 13  ALKPHOS 63  BILITOT 0.2*  PROT 6.6  ALBUMIN 2.4*   Cardiac Enzymes: Recent Labs  06/18/2012 1425 07/04/2012 1952 07/10/2012 0152  TROPONINI 9.19* 10.83* 8.96*   BNP: Pro B Natriuretic peptide (BNP)  Date/Time Value Range Status  06/30/2012  2:25 PM 49043.0* 0 - 125 pg/mL Final   Hemoglobin A1C: Recent Labs  06/12/2012 1425  HGBA1C 6.4*   Anemia Panel: Recent Labs  06/17/2012 1900  FERRITIN 1139*  TIBC 119*  IRON 22*   Lab Results  Component Value Date   P2Y12 275 07/09/2012   Procalcitonin 3.22 07/04/2012   CO-OX 58.2 06/24/2012    TELE:  SR   ECHO: 06/26/2012  Study Conclusions - Left ventricle: Septal apical inferior and inferolateral hypokinesis The cavity size was severely dilated. Barbara Montoya thickness was normal. The estimated ejection fraction was 25%. - Mitral valve: Restrictive mitral inflows Mild regurgitation. - Atrial septum: No defect or patent foramen ovale was identified. - Pericardium, extracardiac: A trivial pericardial effusion was identified.  Radiology/Studies: Dg Chest [facility]  1 View 07/09/2012   *RADIOLOGY REPORT*  Clinical Data: Check ET tube position.Respiratory failure.  PORTABLE CHEST - 1 VIEW  Comparison: 06/26/2012  Findings: Support devices are unchanged.  Bibasilar atelectasis or infiltrates, similar to prior study.  Improving aeration in the left upper lobe.  Suspect trace effusions.  Heart is normal size.  IMPRESSION: Improving aeration in the left upper lobe.  Bibasilar atelectasis, trace effusions.   Original Report Authenticated By: Charlett Nose, M.D.   Dg Chest Port 1 View 06/18/2012   *RADIOLOGY REPORT*  Clinical Data: Intubation, central line placement, hypertension, diabetes  PORTABLE CHEST - 1 VIEW  Comparison: None.  Findings: Endotracheal tube 6 cm above the carina.  Right IJ dialysis catheter  tips in the SVC.  Left IJ central line tip in the mid SVC as well.  Normal heart size and vascularity.  Ill-defined increased opacity throughout the left central lung could represent developing airspace disease versus asymmetric edema.  Right lung relatively clear.  No effusion or pneumothorax. NG tube extends below the hemidiaphragms the tip not visualized.  IMPRESSION: Support apparatus in good position.  No pneumothorax  Ill-defined increased opacity left central lung could represent developing airspace process or asymmetric edema   Original Report Authenticated By: Judie Petit. Miles Costain, M.D.     Current Medications:  . antiseptic oral rinse  15 mL Mouth Rinse Q4H  . aspirin  325 mg Oral Daily  . atorvastatin  80 mg Oral q1800  . carvedilol  12.5 mg Oral BID WC  . chlorhexidine  15 mL Mouth/Throat BID  . ferrous sulfate  325 mg Oral Q breakfast  . insulin aspart  2-6 Units Subcutaneous Q4H  . multivitamin  1 tablet Oral Daily  . pantoprazole (PROTONIX) IV  40 mg Intravenous QHS  . paricalcitol  1 mcg Oral Daily  . sodium bicarbonate  650 mg Oral QID   . sodium chloride 10 mL/hr at 06/15/2012 1709  . dextrose 20 mL/hr at 06/26/2012 0457  . fentaNYL infusion INTRAVENOUS 100 mcg/hr (06/18/2012 0400)  . heparin 850 Units/hr (06/12/2012 0001)  . norepinephrine (LEVOPHED) Adult infusion 5 mcg/min (07/01/2012 0600)  . dialysis replacement fluid (prismasate) 400 mL/hr at 06/25/2012 2008  . dialysis replacement fluid (prismasate) 200 mL/hr at 06/25/2012 2008  . dialysate (PRISMASATE) 1,500 mL/hr at 06/14/2012 0330  . propofol 23.7 mcg/kg/min (06/15/2012 0500)    ASSESSMENT AND PLAN: Barbara Montoya is a 67 year old African American woman with a known history of hypertension, type 2 diabetes and stage V chronic kidney disease with a baseline GFR of around 10. She went to [facility] over the previous weekend and ruled in for MI (late presentation). She cathed at [facility], cath with severe 3-v disease, and transferred to [facility]. CABG planned  when stable. She developed acute resp failure with pulm edema and was intubated 6/25. With worsening respiratory status and EF, she was taken back to the cath lab 6/25 but her anatomy was unchanged.   Principal Problem:   Myocardial infarction - continue current therapy with ASA, statin. Add BB/ACE once BP improves. Will hold Coreg while on Levophed. Her P2Y12 is normal, so no platelet inhibition from Plavix   Active Problems:   Hypertension - hypotensive now, Levo at 5 mcg/min; per CCM   Diabetes mellitus - per CCM   CKD (chronic kidney disease), stage V - per nephrology, CVVH now   Acute respiratory failure - vent mgt per CCM, CVP is now 4, CO-OX 58.2   Acute pulmonary edema - volume mgt  per nephrology   Altered mental status - propofol while on vent. Per CCM   Signed, Theodore Demark , PA-C 6:41 AM 06/11/2012 Patient examined and agree except changes made.  Valera Castle, MD 06/25/2012 7:59 AM

## 2012-07-06 NOTE — Progress Notes (Signed)
Weaned peep to 5 per Dr. Delton Coombes order. Pt tol well so far, sats 100%.

## 2012-07-06 NOTE — Progress Notes (Signed)
ANTICOAGULATION CONSULT NOTE - Follow Up Consult  Pharmacy Consult for : Heparin Indication:  ACS  Dosing Weight: 77 kg  Labs:  Recent Labs  07/09/2012 1425 06/21/2012 0500 06/22/2012 0800  HGB 10.8* 10.2* 10.2*  HCT 32.5* 30.8* 30.8*  PLT 186 163 156  LABPROT 14.6  --   --   INR 1.16  --   --   HEPARINUNFRC 0.65  --  0.40  CREATININE 4.90* 2.87*  --    Lab Results  Component Value Date   INR 1.16 06/18/2012   Medications:  Prescriptions prior to admission  Medication Sig Dispense Refill  . amLODipine (NORVASC) 10 MG tablet Take 10 mg by mouth daily.      Marland Kitchen aspirin 325 MG tablet Take 325 mg by mouth daily.      Marland Kitchen atorvastatin (LIPITOR) 40 MG tablet Take 40 mg by mouth daily.      Marland Kitchen b complex-vitamin c-folic acid (NEPHRO-VITE) 0.8 MG TABS Take 0.8 mg by mouth at bedtime.      . carvedilol (COREG) 25 MG tablet Take 25 mg by mouth 2 (two) times daily with a meal.      . cloNIDine (CATAPRES) 0.1 MG tablet Take 0.1 mg by mouth 3 (three) times daily.      . clopidogrel (PLAVIX) 75 MG tablet Take 75 mg by mouth daily.      . ferrous sulfate 325 (65 FE) MG tablet Take 325 mg by mouth daily with breakfast.      . furosemide (LASIX) 40 MG tablet Take 40 mg by mouth.      Marland Kitchen glimepiride (AMARYL) 4 MG tablet Take 4 mg by mouth daily before breakfast.      . insulin glargine (LANTUS) 100 UNIT/ML injection Inject 12 Units into the skin at bedtime.      . paricalcitol (ZEMPLAR) 1 MCG capsule Take 1 mcg by mouth daily.      . sodium bicarbonate 650 MG tablet Take 650 mg by mouth 4 (four) times daily.       Assessment: 67 y/o AA female transferred to [facility] for treatment/evaluation of Acute MI.  She has a history of CKD with likely progression to ESRD following contrast exposure which revealed significant disease and he will need CABG.  Renal consulting.  Her heparin level this morning is 0.4 IU/ml on IV heparin rate of 850 units/hr.  This is within desired goal range and there has been no  noted bleeding complications.  Goal of therapy: Heparin level 0.3-0.7 units/ml Monitor platelets by anticoagulation protocol: Yes   Plan:  1.  Continue current heparin rate 2.  F/U heparin levels as needed 3.  Monitor for s/s of bleeding  Nadara Mustard, PharmD., MS Clinical Pharmacist Pager:  [phone] Thank you for allowing pharmacy to be part of this patients care team. 07/02/2012, 10:45 AM

## 2012-07-07 LAB — RENAL FUNCTION PANEL
CO2: 25 mEq/L (ref 19–32)
CO2: 25 mEq/L (ref 19–32)
Calcium: 8.5 mg/dL (ref 8.4–10.5)
GFR calc Af Amer: 31 mL/min — ABNORMAL LOW (ref >90)
GFR calc Af Amer: 22 mL/min — ABNORMAL LOW (ref >90)
GFR calc non Af Amer: 19 mL/min — ABNORMAL LOW (ref >90)
Glucose, Bld: 173 mg/dL — ABNORMAL HIGH (ref 70–99)
Phosphorus: 2.7 mg/dL (ref 2.3–4.6)
Potassium: 3.9 mEq/L (ref 3.5–5.1)
Potassium: 3.9 mEq/L (ref 3.5–5.1)
Sodium: 135 mEq/L (ref 135–145)
Sodium: 134 mEq/L — ABNORMAL LOW (ref 135–145)

## 2012-07-07 LAB — CBC
HCT: 30.1 % — ABNORMAL LOW (ref 36.0–46.0)
RDW: 15 % (ref 11.5–15.5)
WBC: 19.8 10*3/uL — ABNORMAL HIGH (ref 4.0–10.5)

## 2012-07-07 LAB — GLUCOSE, CAPILLARY
Glucose-Capillary: 130 mg/dL — ABNORMAL HIGH (ref 70–99)
Glucose-Capillary: 185 mg/dL — ABNORMAL HIGH (ref 70–99)
Glucose-Capillary: 220 mg/dL — ABNORMAL HIGH (ref 70–99)
Glucose-Capillary: 94 mg/dL (ref 70–99)

## 2012-07-07 MED ORDER — ACETAMINOPHEN 325 MG PO TABS
650.0000 mg | ORAL_TABLET | Freq: Four times a day (QID) | ORAL | Status: DC | PRN
Start: 2012-07-07 — End: 2012-07-15
  Administered 2012-07-07 – 2012-07-15 (×9): 650 mg via ORAL
  Filled 2012-07-07 (×9): qty 2

## 2012-07-07 MED ORDER — PHENYLEPHRINE HCL 10 MG/ML IJ SOLN
30.0000 ug/min | INTRAVENOUS | Status: DC
  Administered 2012-07-07: 30 ug/min via INTRAVENOUS
  Filled 2012-07-07: qty 1

## 2012-07-07 MED ORDER — BIOTENE DRY MOUTH MT LIQD
15.0000 mL | Freq: Two times a day (BID) | OROMUCOSAL | Status: DC
  Administered 2012-07-07 – 2012-07-15 (×16): 15 mL via OROMUCOSAL

## 2012-07-07 MED ORDER — SODIUM CHLORIDE 0.9 % IV BOLUS (SEPSIS)
250.0000 mL | Freq: Once | INTRAVENOUS | Status: AC
  Administered 2012-07-07: 250 mL via INTRAVENOUS

## 2012-07-07 MED ORDER — NOREPINEPHRINE BITARTRATE 1 MG/ML IJ SOLN
2.0000 ug/min | INTRAVENOUS | Status: DC
  Administered 2012-07-07: 2.5 ug/min via INTRAVENOUS
  Filled 2012-07-07: qty 4

## 2012-07-07 MED ORDER — DOBUTAMINE IN D5W 4-5 MG/ML-% IV SOLN
2.5000 ug/kg/min | INTRAVENOUS | Status: DC
  Administered 2012-07-07: 2.5 ug/kg/min via INTRAVENOUS

## 2012-07-07 MED ORDER — PANTOPRAZOLE SODIUM 40 MG PO PACK
40.0000 mg | PACK | Freq: Every day | ORAL | Status: DC
  Filled 2012-07-07: qty 20

## 2012-07-07 MED ORDER — PANTOPRAZOLE SODIUM 40 MG PO TBEC
40.0000 mg | DELAYED_RELEASE_TABLET | Freq: Every day | ORAL | Status: DC
  Administered 2012-07-08 – 2012-07-14 (×6): 40 mg via ORAL
  Filled 2012-07-07 (×6): qty 1

## 2012-07-07 MED ORDER — DOBUTAMINE-DEXTROSE 2-5 MG/ML-% IV SOLN
2.5000 ug/kg/min | INTRAVENOUS | Status: DC
  Filled 2012-07-07: qty 250

## 2012-07-07 NOTE — Progress Notes (Signed)
Discussed pt's increased HR with MD at bedside; d/c dobutamine gtt; Pt tolerating sips and ice chips; OK to advance diet to renal diet;  Will continue to monitor closely and update as needed Shearon Balo, RN

## 2012-07-07 NOTE — Progress Notes (Signed)
ANTICOAGULATION CONSULT NOTE - Follow Up Consult  Pharmacy Consult for : Heparin Indication:  ACS  Dosing Weight: 77 kg  Labs:  Recent Labs  06/24/2012 1425 06/12/2012 0500 06/14/2012 0800 06/25/2012 1600 07/07/12 0415 07/07/12 0500  HGB 10.8* 10.2* 10.2*  --   --  10.0*  HCT 32.5* 30.8* 30.8*  --   --  30.1*  PLT 186 163 156  --   --  151  LABPROT 14.6  --   --   --   --   --   INR 1.16  --   --   --   --   --   HEPARINUNFRC 0.65  --  0.40  --  0.27*  --   CREATININE 4.90* 2.87*  --  2.12*  --  1.87*   Lab Results  Component Value Date   INR 1.16 07/04/2012   Medications:  Prescriptions prior to admission  Medication Sig Dispense Refill  . amLODipine (NORVASC) 10 MG tablet Take 10 mg by mouth daily.      Marland Kitchen aspirin 325 MG tablet Take 325 mg by mouth daily.      Marland Kitchen atorvastatin (LIPITOR) 40 MG tablet Take 40 mg by mouth daily.      Marland Kitchen b complex-vitamin c-folic acid (NEPHRO-VITE) 0.8 MG TABS Take 0.8 mg by mouth at bedtime.      . carvedilol (COREG) 25 MG tablet Take 25 mg by mouth 2 (two) times daily with a meal.      . cloNIDine (CATAPRES) 0.1 MG tablet Take 0.1 mg by mouth 3 (three) times daily.      . clopidogrel (PLAVIX) 75 MG tablet Take 75 mg by mouth daily.      . ferrous sulfate 325 (65 FE) MG tablet Take 325 mg by mouth daily with breakfast.      . furosemide (LASIX) 40 MG tablet Take 40 mg by mouth.      Marland Kitchen glimepiride (AMARYL) 4 MG tablet Take 4 mg by mouth daily before breakfast.      . insulin glargine (LANTUS) 100 UNIT/ML injection Inject 12 Units into the skin at bedtime.      . paricalcitol (ZEMPLAR) 1 MCG capsule Take 1 mcg by mouth daily.      . sodium bicarbonate 650 MG tablet Take 650 mg by mouth 4 (four) times daily.       Assessment: 67 y/o AA female transferred to [facility] for treatment/evaluation of Acute MI.  She has a history of CKD with likely progression to ESRD following contrast exposure which revealed significant disease.  TCTS referral and she is  not currently a candidate for CABG.  She was started on CVVHD yesterday and will likely continue for now.  Her heparin level this morning is 0.27 IU/ml on IV heparin rate of 850 units/hr.  This has dropped some from yesterday and is just at the lower end of goal range.  Her CBC is still at her baseline and there has been no noted bleeding complications.  Goal of therapy: Heparin level 0.3-0.7 units/ml Monitor platelets by anticoagulation protocol: Yes   Plan:  1.  Will increase Heparin rate slightly to 950 units/hr. 2.  F/U AM heparin level since she is close to desired goal 3.  Monitor for s/s of bleeding  Nadara Mustard, PharmD., MS Clinical Pharmacist Pager:  [phone] Thank you for allowing pharmacy to be part of this patients care team. 07/07/2012, 8:13 AM

## 2012-07-07 NOTE — Progress Notes (Signed)
ANTIBIOTIC CONSULT NOTE - INITIAL  Pharmacy Consult for vancomycin, Zosyn Indication: rule out sepsis  No Known Allergies  Patient Measurements: Height: 5\' 6"  (167.6 cm) Weight: 181 lb (82.1 kg) IBW/kg (Calculated) : 59.3  Vital Signs: Temp: 98.5 F (36.9 C) (06/27 1600) Temp src: Oral (06/27 1600) BP: 109/69 mmHg (06/27 2345) Pulse Rate: 127 (06/27 2345) Intake/Output from previous day: 06/26 0701 - 06/27 0700 In: 2041.8 [I.V.:1581.8; NG/GT:460] Out: 3153 [Urine:67] Intake/Output from this shift: Total I/O In: 488.3 [I.V.:238.3; IV Piggyback:250] Out: 8 [Urine:8]  Labs:  Recent Labs  July 30, 2012 0500 2012/07/30 0800 07/30/2012 1600 07/07/12 0500 07/07/12 1500  WBC 15.9* 16.9*  --  19.8*  --   HGB 10.2* 10.2*  --  10.0*  --   PLT 163 156  --  151  --   CREATININE 2.87*  --  2.12* 1.87* 2.49*   Estimated Creatinine Clearance: 23.7 ml/min (by C-G formula based on Cr of 2.49). No results found for this basename: VANCOTROUGH, Leodis Binet, VANCORANDOM, GENTTROUGH, GENTPEAK, GENTRANDOM, TOBRATROUGH, TOBRAPEAK, TOBRARND, AMIKACINPEAK, AMIKACINTROU, AMIKACIN,  in the last 72 hours   Microbiology: Recent Results (from the past 720 hour(s))  MRSA PCR SCREENING     Status: None   Collection Time    06/12/2012  1:31 PM      Result Value Range Status   MRSA by PCR NEGATIVE  NEGATIVE Final   Comment:            The GeneXpert MRSA Assay (FDA     approved for NASAL specimens     only), is one component of a     comprehensive MRSA colonization     surveillance program. It is not     intended to diagnose MRSA     infection nor to guide or     monitor treatment for     MRSA infections.  URINE CULTURE     Status: None   Collection Time    07/02/2012  3:11 PM      Result Value Range Status   Specimen Description URINE, RANDOM   Final   Special Requests NONE   Final   Culture  Setup Time 06/13/2012 16:30   Final   Colony Count NO GROWTH   Final   Culture NO GROWTH   Final   Report  Status 2012-07-30 FINAL   Final  SURGICAL PCR SCREEN     Status: None   Collection Time    07/06/2012  3:34 PM      Result Value Range Status   MRSA, PCR NEGATIVE  NEGATIVE Final   Staphylococcus aureus NEGATIVE  NEGATIVE Final   Comment:            The Xpert SA Assay (FDA     approved for NASAL specimens     in patients over 21 years of age),     is one component of     a comprehensive surveillance     program.  Test performance has     been validated by [employer] for patients greater     than or equal to 42 year old.     It is not intended     to diagnose infection nor to     guide or monitor treatment.    Medical History: Past Medical History  Diagnosis Date  . Hypertension   . Diabetes mellitus   . H/O: CVA (cerebrovascular accident)   . CKD (chronic kidney disease), stage V   .  Hyperlipidemia     Medications:  Scheduled:  . antiseptic oral rinse  15 mL Mouth Rinse BID  . aspirin  325 mg Oral Daily  . atorvastatin  80 mg Oral q1800  . ferrous sulfate  325 mg Oral Q breakfast  . insulin aspart  2-6 Units Subcutaneous Q4H  . multivitamin  1 tablet Oral Daily  . [START ON 07/08/2012] pantoprazole  40 mg Oral Daily  . paricalcitol  1 mcg Oral Daily   Assessment: 67 yo female known to pharmacy from heparin management. Pharmacy now to manage vancomycin and Zosyn for possible sepsis. Patient was previously on CVVH, and now plan for intermittent HD per nephrology.   Goal of Therapy:  Pre-HD vancomycin level 15 - 25 mcg/mL  Plan:  1. Zosyn 2.25gm IV Q8H.  2. Vancomycin 1750mg  IV X 1.  3. Follow-up HD plan for subsequent vancomycin dosing.   Emeline Gins 07/07/2012,11:58 PM

## 2012-07-07 NOTE — Progress Notes (Signed)
Patient Name: Barbara Montoya Date of Encounter: 07/07/2012  Principal Problem:   Myocardial infarction Active Problems:   Hypertension   Diabetes mellitus   CKD (chronic kidney disease), stage V   Acute respiratory failure   Acute pulmonary edema   Altered mental status   Hypotension   Cardiomyopathy, ischemic    SUBJECTIVE: Intubated, responds to questions by shaking head. Denies chest pain or dyspnea.  OBJECTIVE Filed Vitals:   16-Jul-2012 0545 July 16, 2012 0600 08/02/2012 0615 16-Jul-2012 0630  BP: 105/67 98/65 102/58 97/80  Pulse: 58 58 59 58  Temp:      TempSrc:      Resp: 18 18 18 18   CVP    4  Weight:      SpO2: 100% 100% 100% 100%    Intake/Output Summary (Last 24 hours) at 07/07/12 0846 Last data filed at 07/07/12 0800  Gross per 24 hour  Intake 2067.89 ml  Output   3147 ml  Net -1079.11 ml   Filed Weights   06/30/2012 1400 07/25/2012 0500 07/07/12 0500  Weight: 185 lb 6.5 oz (84.1 kg) 182 lb 5.1 oz (82.7 kg) 181 lb (82.1 kg)    PHYSICAL EXAM General: Well developed, well nourished, female   Head: Normocephalic, atraumatic.  Neck: Supple  Lungs:  CTA anteriorly. Heart: RRR Abdomen: Soft, non-tender, non-distended Extremities: No edema   LABS: ABG    Component Value Date/Time   PHART 7.464* 06/11/2012 1603   PCO2ART 27.8* 06/15/2012 1603   PO2ART 79.0* 06/17/2012 1603   HCO3 20.0 06/23/2012 1603   TCO2 21 06/13/2012 1603   ACIDBASEDEF 3.0* 06/13/2012 1603   O2SAT 58.2 08/03/2012 0341   CBC: Recent Labs  06/22/2012 1425  July 16, 2012 0800 07/07/12 0500  WBC 18.8*  < > 16.9* 19.8*  NEUTROABS 16.4*  --   --   --   HGB 10.8*  < > 10.2* 10.0*  HCT 32.5*  < > 30.8* 30.1*  MCV 67.6*  < > 67.4* 67.9*  PLT 186  < > 156 151  < > = values in this interval not displayed. INR:  Recent Labs  06/24/2012 1425  INR 1.16   Basic Metabolic Panel: Recent Labs  07/04/2012 1425  07/14/2012 1600 07/07/12 0500  NA 139  < > 137 135  K 3.1*  < > 3.9 3.9  CL 99  < > 100 99    CO2 21  < > 23 25  GLUCOSE 91  < > 146* 173*  BUN 53*  < > 24* 19  CREATININE 4.90*  < > 2.12* 1.87*  CALCIUM 9.2  < > 8.2* 8.5  MG 2.0  --   --   --   PHOS  --   --  2.9 2.7  < > = values in this interval not displayed. Liver Function Tests:  Recent Labs  07/03/2012 1425 07/17/2012 1600 07/07/12 0500  AST 38*  --   --   ALT 13  --   --   ALKPHOS 63  --   --   BILITOT 0.2*  --   --   PROT 6.6  --   --   ALBUMIN 2.4* 2.5* 2.4*   Cardiac Enzymes:  Recent Labs  06/12/2012 1425 06/17/2012 1952 07/11/2012 0152  TROPONINI 9.19* 10.83* 8.96*   BNP: Pro B Natriuretic peptide (BNP)  Date/Time Value Range Status  07/01/2012  2:25 PM 49043.0* 0 - 125 pg/mL Final   Hemoglobin A1C:  Recent Labs  06/11/2012 1425  HGBA1C  6.4*   Anemia Panel:  Recent Labs  07/04/2012 1900  FERRITIN 1139*  TIBC 119*  IRON 22*   Lab Results  Component Value Date   P2Y12 275 07/02/2012   Procalcitonin 3.22 06/28/2012   CO-OX 58.2 06/30/2012    TELE:  SR   ECHO: 06/21/2012  Study Conclusions - Left ventricle: Septal apical inferior and inferolateral hypokinesis The cavity size was severely dilated. Wall thickness was normal. The estimated ejection fraction was 25%. - Mitral valve: Restrictive mitral inflows Mild regurgitation. - Atrial septum: No defect or patent foramen ovale was identified. - Pericardium, extracardiac: A trivial pericardial effusion was identified.  Radiology/Studies: Dg Chest Port 1 View 07/02/2012   *RADIOLOGY REPORT*  Clinical Data: Check ET tube position.Respiratory failure.  PORTABLE CHEST - 1 VIEW  Comparison: 06/17/2012  Findings: Support devices are unchanged.  Bibasilar atelectasis or infiltrates, similar to prior study.  Improving aeration in the left upper lobe.  Suspect trace effusions.  Heart is normal size.  IMPRESSION: Improving aeration in the left upper lobe.  Bibasilar atelectasis, trace effusions.   Original Report Authenticated By: Charlett Nose, M.D.   Dg  Chest Port 1 View 07/09/2012   *RADIOLOGY REPORT*  Clinical Data: Intubation, central line placement, hypertension, diabetes  PORTABLE CHEST - 1 VIEW  Comparison: None.  Findings: Endotracheal tube 6 cm above the carina.  Right IJ dialysis catheter tips in the SVC.  Left IJ central line tip in the mid SVC as well.  Normal heart size and vascularity.  Ill-defined increased opacity throughout the left central lung could represent developing airspace disease versus asymmetric edema.  Right lung relatively clear.  No effusion or pneumothorax. NG tube extends below the hemidiaphragms the tip not visualized.  IMPRESSION: Support apparatus in good position.  No pneumothorax  Ill-defined increased opacity left central lung could represent developing airspace process or asymmetric edema   Original Report Authenticated By: Judie Petit. Miles Costain, M.D.     Current Medications:  . antiseptic oral rinse  15 mL Mouth Rinse Q4H  . aspirin  325 mg Oral Daily  . atorvastatin  80 mg Oral q1800  . chlorhexidine  15 mL Mouth/Throat BID  . feeding supplement (OSMOLITE 1.2 CAL)  1,000 mL Per Tube Q24H  . ferrous sulfate  325 mg Oral Q breakfast  . insulin aspart  2-6 Units Subcutaneous Q4H  . multivitamin  1 tablet Oral Daily  . pantoprazole sodium  40 mg Per Tube QHS  . paricalcitol  1 mcg Oral Daily   . sodium chloride 10 mL/hr at 07/07/12 0800  . sodium chloride 10 mL/hr at 07/07/12 0800  . dextrose 20 mL/hr at 07/07/12 0800  . DOBUTamine 2.5 mcg/kg/min (07/07/12 0800)  . fentaNYL infusion INTRAVENOUS 100 mcg/hr (07/07/12 0800)  . heparin 950 Units/hr (07/07/12 0818)  . norepinephrine (LEVOPHED) Adult infusion Stopped (06/17/2012 1900)  . dialysis replacement fluid (prismasate) 400 mL/hr at 07/05/2012 2241  . dialysis replacement fluid (prismasate) 200 mL/hr at 07/01/2012 2250  . dialysate (PRISMASATE) 1,500 mL/hr at 07/07/12 7829    ASSESSMENT AND PLAN: Ms Scalese is a 67 year old African American woman with a known history  of hypertension, type 2 diabetes and stage V chronic kidney disease with a baseline GFR of around 10. She went to [facility] over the previous weekend and ruled in for MI (late presentation). She was cathed at [facility]; cath with severe 3-v disease, and transferred to [facility]. CABG planned when stable. She developed acute resp failure with pulm edema and was  intubated 6/25. With worsening respiratory status and EF, she was taken back to the cath lab 6/25 but her anatomy was unchanged.   Principal Problem: 1 Myocardial infarction - continue current therapy with ASA, heparin and statin. Patient now off of levophed. She is on dobutamine and blood pressure much better. Add BB/ACE it is clear BP stable. Once she is more stable will consider CABG if she is felt to be a surgical candidate. Otherwise can consider PCI. Note Dr. Donata Clay felt she is not a CABG candidate at present. 2 CKD (chronic kidney disease), stage V - per nephrology, CVVH now 3 Acute respiratory failure - vent mgt per CCM; possible extubation today.    Signed, Olga Millers , MD  8:46 AM 07/07/2012

## 2012-07-07 NOTE — Progress Notes (Signed)
MD paged about pt's persistent low BP; awaiting orders; will continue to monitor and update as needed

## 2012-07-07 NOTE — Progress Notes (Addendum)
Cardiology Night Cross-cover Called to bedside due to increasing levophed requirements and worsening tachycardia. Briefly, 67 yo female with DM, CKD, HTN who initially presented to [place] on 6/23 with an MI in the setting of LBBB. Cath revealed 3 vessel disease and EF reduced. Transferred to [facility] and decompensated during CRRT requiring intubation. Repeat echo demonstrated worse EF and urgent cath done due to concern of re-infarction - cath unchanged. During hospitalization, has had labile blood pressure requiring intermittent dobutamine now switched to levophed due to tachycardia. Now with continued tachycardia. Small fluid boluses of 250 ml without sig benefit and causing CVP to be 12. 12 lead un-informative due LBBB.  Concerning that escalating pressors is resulting in further ischemia. Cause of hypotension could be sepsis. Noted to have elevated white count and elevated pro-calcitonin, though in the setting of recent MI, meaning of this less clear.  Discussed with intensivist who agrees broad spectrum abx are appropriate. Also discussed with interventionalist on call regarding indication for balloon pump: unappealing in the setting of possible sepsis.   Co-ox of 59.  Plan: 1. Empiric trial of neosynephrine to minimize tachycardia though aware that this is difficult situation of heart failure, ischemia and possible sepsis. Repeat co-ox. Other possibility is vasopressin and levophed. Ernestine Conrad may be helpful as well.  2. Broad spectrum abx.   Closely monitor. Guarded prognosis.  Addendum: Neosynephrine resulted in co-ox reduction to 45. Addition of milrinone without benefit. Will return to levophed and see if addition of milrinone is of benefit using co-ox as guide. Family updated regarding the difficult situation.

## 2012-07-07 NOTE — Progress Notes (Signed)
1. Chronic kidney disease stage V now with likely progression to end-stage renal disease following MI/contrast exposure.  2. Acute myocardial infarction:  3. Anemia:  4. Ventilator dependent respiratory failure  Plan: Stop CVVHD, intermittent HD in next day or so;   Subjective: Interval History:  Tolerated CVVHD  Objective: Vital signs in last 24 hours: Temp:  [92.8 F (33.8 C)-98.2 F (36.8 C)] 98 F (36.7 C) (06/27 0800) Pulse Rate:  [57-109] 95 (06/27 1000) Resp:  [12-23] 16 (06/27 1000) BP: (92-143)/(47-117) 139/61 mmHg (06/27 1000) SpO2:  [99 %-100 %] 100 % (06/27 1000) FiO2 (%):  [40 %] 40 % (06/27 0800) Weight:  [82.1 kg (181 lb)] 82.1 kg (181 lb) (06/27 0500) Weight change: -2 kg (-4 lb 6.6 oz)  Intake/Output from previous day: 06/26 0701 - 06/27 0700 In: 2041.8 [I.V.:1581.8; NG/GT:460] Out: 3153 [Urine:67] Intake/Output this shift: Total I/O In: 276.5 [I.V.:186.5; NG/GT:90] Out: 130 [Other:130]  General appearance: alert and cooperative Resp: clear to auscultation bilaterally Cardio: regular rate and rhythm, S1, S2 normal, no murmur, click, rub or gallop GI: soft, non-tender; bowel sounds normal; no masses,  no organomegaly Extremities: extremities normal, atraumatic, no cyanosis or edema  Lab Results:  Recent Labs  28-Jul-2012 0800 07/07/12 0500  WBC 16.9* 19.8*  HGB 10.2* 10.0*  HCT 30.8* 30.1*  PLT 156 151   BMET:  Recent Labs  Jul 28, 2012 1600 07/07/12 0500  NA 137 135  K 3.9 3.9  CL 100 99  CO2 23 25  GLUCOSE 146* 173*  BUN 24* 19  CREATININE 2.12* 1.87*  CALCIUM 8.2* 8.5   No results found for this basename: PTH,  in the last 72 hours Iron Studies:  Recent Labs  07/07/2012 1900  IRON 22*  TIBC 119*  FERRITIN 1139*   Studies/Results: Dg Chest Port 1 View  07/28/2012   *RADIOLOGY REPORT*  Clinical Data: Check ET tube position.Respiratory failure.  PORTABLE CHEST - 1 VIEW  Comparison: 06/28/2012  Findings: Support devices are unchanged.   Bibasilar atelectasis or infiltrates, similar to prior study.  Improving aeration in the left upper lobe.  Suspect trace effusions.  Heart is normal size.  IMPRESSION: Improving aeration in the left upper lobe.  Bibasilar atelectasis, trace effusions.   Original Report Authenticated By: Charlett Nose, M.D.   Dg Chest Port 1 View  06/15/2012   *RADIOLOGY REPORT*  Clinical Data: Intubation, central line placement, hypertension, diabetes  PORTABLE CHEST - 1 VIEW  Comparison: None.  Findings: Endotracheal tube 6 cm above the carina.  Right IJ dialysis catheter tips in the SVC.  Left IJ central line tip in the mid SVC as well.  Normal heart size and vascularity.  Ill-defined increased opacity throughout the left central lung could represent developing airspace disease versus asymmetric edema.  Right lung relatively clear.  No effusion or pneumothorax. NG tube extends below the hemidiaphragms the tip not visualized.  IMPRESSION: Support apparatus in good position.  No pneumothorax  Ill-defined increased opacity left central lung could represent developing airspace process or asymmetric edema   Original Report Authenticated By: Judie Petit. Shick, M.D.   Scheduled: . antiseptic oral rinse  15 mL Mouth Rinse Q4H  . aspirin  325 mg Oral Daily  . atorvastatin  80 mg Oral q1800  . chlorhexidine  15 mL Mouth/Throat BID  . feeding supplement (OSMOLITE 1.2 CAL)  1,000 mL Per Tube Q24H  . ferrous sulfate  325 mg Oral Q breakfast  . insulin aspart  2-6 Units Subcutaneous Q4H  .  multivitamin  1 tablet Oral Daily  . pantoprazole sodium  40 mg Per Tube QHS  . paricalcitol  1 mcg Oral Daily     LOS: 2 days   Mischa Pollard C 07/07/2012,10:59 AM

## 2012-07-07 NOTE — Progress Notes (Signed)
MD paged to notify of increased HR; dobutamine d/c; lebaurer cards notified; will continue to monitor and update as needed

## 2012-07-07 NOTE — Progress Notes (Signed)
eLink Physician-Brief Progress Note Patient Name: Barbara Montoya DOB: 24-Jun-1945 MRN: [id number]  Date of Service  07/07/2012   HPI/Events of Note  Shock recurred.    eICU Interventions  Resumed Dobutamine 2.59mcg/kg/min   Intervention Category Major Interventions: Shock - evaluation and management  Shan Levans 07/07/2012, 6:49 PM

## 2012-07-07 NOTE — Progress Notes (Signed)
PULMONARY  / CRITICAL CARE MEDICINE  Name: Barbara Montoya MRN: [id number] DOB: [birth year]    ADMISSION DATE:  06/21/2012 CONSULTATION DATE:  06/15/2012  REFERRING MD :  Kirke Corin PRIMARY SERVICE: Cardiology  CHIEF COMPLAINT:  NSTEMI  BRIEF PATIENT DESCRIPTION: 67 year old female admitted 6/25 from [place] following NSTEMI, c/b respiratory failure and AKI. In cardiogenic shock, new dialysis requirement.   SIGNIFICANT EVENTS / STUDIES:  6/23 ECHO >>> EF 40% 6/24 Cath >>> Mid LAD 99% stenosis, Ramus intermedius 90% stenosis, Proximal RCA 70% stenosis, Mid RCA 50% stenosis, RPDA 100% stenosis, RPLS 90% stenosis .................................................................................................................................. 6/25 Admit to [facility] 6/25 TTE >> dilated LV, EF 25%, focal hypokinesis  LINES / TUBES: ETT 8.0 6/25 >>> L IJ TLC ([facility]) >>> R [place] HD cath >>>  CULTURES: BC ([facility])>>> 6/25 UC>>> negative  ANTIBIOTICS:  SUBJECTIVE / Interval events: Nods to questions norepi weaned to off after addition dobuta 6/26  VITAL SIGNS: Temp:  [92.2 F (33.4 C)-98.2 F (36.8 C)] 98 F (36.7 C) (06/27 0800) Pulse Rate:  [57-109] 96 (06/27 0930) Resp:  [12-23] 15 (06/27 0930) BP: (84-143)/(47-117) 138/62 mmHg (06/27 0930) SpO2:  [99 %-100 %] 100 % (06/27 0930) FiO2 (%):  [40 %] 40 % (06/27 0800) Weight:  [82.1 kg (181 lb)] 82.1 kg (181 lb) (06/27 0500)  HEMODYNAMICS: CVP:  [1 mmHg-6 mmHg] 5 mmHg  VENTILATOR SETTINGS: Vent Mode:  [-] CPAP FiO2 (%):  [40 %] 40 % Set Rate:  [18 bmp] 18 bmp Vt Set:  [500 mL] 500 mL PEEP:  [5 cmH20] 5 cmH20 Pressure Support:  [12 cmH20] 12 cmH20 Plateau Pressure:  [15 cmH20-23 cmH20] 20 cmH20  INTAKE / OUTPUT: Intake/Output     06/26 0701 - 06/27 0700 06/27 0701 - 06/28 0700   I.V. (mL/kg) 1581.8 (19.3) 123.9 (1.5)   NG/GT 460 60   Total Intake(mL/kg) 2041.8 (24.9) 183.9 (2.2)   Urine (mL/kg/hr) 67 (0)    Other 3086 (1.6) 130 (0.6)    Total Output 3153 130   Net -1111.2 +53.9          PHYSICAL EXAMINATION: General:  Obese female who appears older than stated age Neuro:  Sedated. PERRL HEENT:  No scleral icterus Cardiovascular:  RRR, S1 and S2, no JVD, no carotid bruit, no murmur, no heave, no thrill Lungs:  CTA bilateral anterior Abdomen:  Round, soft, non-tender. Active BS Musculoskeletal:  MAE well and equal Skin:  Pink, warm, dry and intact  LABS:  Recent Labs Lab 06/25/2012 1425 07/01/2012 1603 06/20/2012 1952 Aug 05, 2012 0152 2012-08-05 0500 08/05/2012 0800 2012-08-05 1600 07/07/12 0500  HGB 10.8*  --   --   --  10.2* 10.2*  --  10.0*  WBC 18.8*  --   --   --  15.9* 16.9*  --  19.8*  PLT 186  --   --   --  163 156  --  151  NA 139  --   --   --  136  --  137 135  K 3.1*  --   --   --  3.7  --  3.9 3.9  CL 99  --   --   --  99  --  100 99  CO2 21  --   --   --  24  --  23 25  GLUCOSE 91  --   --   --  201*  --  146* 173*  BUN 53*  --   --   --  35*  --  24* 19  CREATININE 4.90*  --   --   --  2.87*  --  2.12* 1.87*  CALCIUM 9.2  --   --   --  7.5*  --  8.2* 8.5  MG 2.0  --   --   --   --   --   --   --   PHOS  --   --   --   --   --   --  2.9 2.7  AST 38*  --   --   --   --   --   --   --   ALT 13  --   --   --   --   --   --   --   ALKPHOS 63  --   --   --   --   --   --   --   BILITOT 0.2*  --   --   --   --   --   --   --   PROT 6.6  --   --   --   --   --   --   --   ALBUMIN 2.4*  --   --   --   --   --  2.5* 2.4*  INR 1.16  --   --   --   --   --   --   --   TROPONINI 9.19*  --  10.83* 8.96*  --   --   --   --   PROCALCITON 5.33  --   --   --  3.22  --   --  3.03  PROBNP 49043.0*  --   --   --   --   --   --   --   PHART  --  7.464*  --   --   --   --   --   --   PCO2ART  --  27.8*  --   --   --   --   --   --   PO2ART  --  79.0*  --   --   --   --   --   --     Recent Labs Lab 07-19-2012 1557 07/19/12 1957 07/07/12 0009 07/07/12 0415 07/07/12 0727  GLUCAP 152* 130* 94 166* 201*    CXR:  6/26 > ETT and lines are in acceptable position. Improving LUL and B infiltrates  ASSESSMENT / PLAN:  PULMONARY A:  Acute respiratory failure - due to pulmonary edema LL Opacity - edema vs infiltrate, volume up lends to toward edema, improved 6/26 P:   - start SBT 6/27, goal extubation if tolerated - follow CXR and ABG   CARDIOVASCULAR A:  NSTEMI Shock, presumed cardiogenic; LVEF 25%, dilated LV Coronary Artery Disease multivessel disease evident on cath, no change on repeat at [facility] 6/25 Hyperlipidemia chronic Hx HTN  P:  - continue dobuta and wean as possible - appreciate Dr vanTrigt's eval, not a candidate for CABG at this time. Consider PTCI instead - ASA, lipitor, heparin  RENAL A:   Stage V CKD baseline Cr 4.6 P:   - CRRT for volume removal, suspect she  - Avoid nephrotoxic medications  GASTROINTESTINAL A:  No active issues P:   - start nutrition  - Protonix for SUP  HEMATOLOGIC A:   Leukocytosis likely related to the MI Coagulopathy due to heparin infusion  Iron deficiency anemia chronic P:  - Continue ferrous sulfate - Follow CBC - Heparin, no need for SCD's while on gtt  INFECTIOUS A:   Leukocytosis - likely secondary to acute stress response, MI.  No hx of infectious symptoms, Pct 3.22 P:   - Follow WBC - BC pending from OSH - will start empiric abx if he has fever or other indication  ENDOCRINE A:   Type 2 DM with hyperglycemia   P:   - CBG q4h - SSI  NEUROLOGIC A:   Acute encephalopathy due to critical illness P:   - Fentanyl infusion for pain control - versed prn - Goal RASS -1 to -0 - Daily awakening trials  TODAY'S SUMMARY: Barbara Montoya presents following NSTEMI with significant CAD. She also has baseline CKD stage V. The most likely etiology for her respiratory failure is pulmonary edema. On CRRT for volume removal. Goal extubation 6/27. Cardiology guiding either PTCI or CABG.    I have personally obtained a history, examined  the patient, evaluated laboratory and imaging results, formulated the assessment and plan and placed orders.  CRITICAL CARE: The patient is critically ill with multiple organ systems failure and requires high complexity decision making for assessment and support, frequent evaluation and titration of therapies, application of advanced monitoring technologies and extensive interpretation of multiple databases. Critical Care Time devoted to patient care services described in this note is 45 minutes.     Levy Pupa, MD, PhD 07/07/2012, 9:48 AM [facility] [phone] or if no answer [phone]

## 2012-07-08 ENCOUNTER — Inpatient Hospital Stay (HOSPITAL_COMMUNITY): Payer: Medicare Other

## 2012-07-08 DIAGNOSIS — I10 Essential (primary) hypertension: Secondary | ICD-10-CM

## 2012-07-08 LAB — CK TOTAL AND CKMB (NOT AT ARMC)
CK, MB: 76.2 ng/mL (ref 0.3–4.0)
Total CK: 881 U/L — ABNORMAL HIGH (ref 7–177)

## 2012-07-08 LAB — CBC
HCT: 25.2 % — ABNORMAL LOW (ref 36.0–46.0)
HCT: 25.1 % — ABNORMAL LOW (ref 36.0–46.0)
Hemoglobin: 8.2 g/dL — ABNORMAL LOW (ref 12.0–15.0)
Hemoglobin: 8.1 g/dL — ABNORMAL LOW (ref 12.0–15.0)
MCH: 22.1 pg — ABNORMAL LOW (ref 26.0–34.0)
MCHC: 32.3 g/dL (ref 30.0–36.0)
MCV: 68.7 fL — ABNORMAL LOW (ref 78.0–100.0)
MCV: 68.4 fL — ABNORMAL LOW (ref 78.0–100.0)
RBC: 3.67 MIL/uL — ABNORMAL LOW (ref 3.87–5.11)
RBC: 3.67 MIL/uL — ABNORMAL LOW (ref 3.87–5.11)
RDW: 15.2 % (ref 11.5–15.5)
WBC: 15.7 10*3/uL — ABNORMAL HIGH (ref 4.0–10.5)

## 2012-07-08 LAB — RENAL FUNCTION PANEL
Albumin: 2.2 g/dL — ABNORMAL LOW (ref 3.5–5.2)
Albumin: 2.2 g/dL — ABNORMAL LOW (ref 3.5–5.2)
BUN: 50 mg/dL — ABNORMAL HIGH (ref 6–23)
Chloride: 101 mEq/L (ref 96–112)
Chloride: 97 mEq/L (ref 96–112)
GFR calc non Af Amer: 13 mL/min — ABNORMAL LOW (ref >90)
GFR calc non Af Amer: 10 mL/min — ABNORMAL LOW (ref >90)
Phosphorus: 5.1 mg/dL — ABNORMAL HIGH (ref 2.3–4.6)
Potassium: 4 mEq/L (ref 3.5–5.1)
Potassium: 3.7 mEq/L (ref 3.5–5.1)
Sodium: 132 mEq/L — ABNORMAL LOW (ref 135–145)

## 2012-07-08 LAB — CARBOXYHEMOGLOBIN
Carboxyhemoglobin: 1 % (ref 0.5–1.5)
Carboxyhemoglobin: 1.1 % (ref 0.5–1.5)
Carboxyhemoglobin: 1.3 % (ref 0.5–1.5)
Methemoglobin: 0.6 % (ref 0.0–1.5)
Methemoglobin: 0.8 % (ref 0.0–1.5)
Methemoglobin: 0.8 % (ref 0.0–1.5)
Methemoglobin: 1.2 % (ref 0.0–1.5)
Methemoglobin: 1.2 % (ref 0.0–1.5)
O2 Saturation: 59 %
Total hemoglobin: 8.3 g/dL — ABNORMAL LOW (ref 12.0–16.0)
Total hemoglobin: 8.4 g/dL — ABNORMAL LOW (ref 12.0–16.0)
Total hemoglobin: 8.7 g/dL — ABNORMAL LOW (ref 12.0–16.0)
Total hemoglobin: 9.1 g/dL — ABNORMAL LOW (ref 12.0–16.0)

## 2012-07-08 LAB — GLUCOSE, CAPILLARY
Glucose-Capillary: 133 mg/dL — ABNORMAL HIGH (ref 70–99)
Glucose-Capillary: 157 mg/dL — ABNORMAL HIGH (ref 70–99)
Glucose-Capillary: 164 mg/dL — ABNORMAL HIGH (ref 70–99)
Glucose-Capillary: 184 mg/dL — ABNORMAL HIGH (ref 70–99)

## 2012-07-08 LAB — TROPONIN I: Troponin I: 17.43 ng/mL (ref <0.30)

## 2012-07-08 MED ORDER — MILRINONE IN DEXTROSE 20 MG/100ML IV SOLN
0.3750 ug/kg/min | INTRAVENOUS | Status: DC
  Administered 2012-07-08: 0.25 ug/kg/min via INTRAVENOUS
  Administered 2012-07-08: 0.125 ug/kg/min via INTRAVENOUS
  Administered 2012-07-09 – 2012-07-13 (×7): 0.25 ug/kg/min via INTRAVENOUS
  Administered 2012-07-14 – 2012-07-15 (×3): 0.375 ug/kg/min via INTRAVENOUS
  Filled 2012-07-08 (×12): qty 100

## 2012-07-08 MED ORDER — ONDANSETRON HCL 4 MG/2ML IJ SOLN
INTRAMUSCULAR | Status: AC
  Administered 2012-07-08: 4 mg via INTRAVENOUS
  Filled 2012-07-08: qty 2

## 2012-07-08 MED ORDER — DEXTROSE 5 % IV SOLN
30.0000 ug/min | INTRAVENOUS | Status: DC
  Filled 2012-07-08: qty 4

## 2012-07-08 MED ORDER — VANCOMYCIN HCL 10 G IV SOLR
1750.0000 mg | Freq: Once | INTRAVENOUS | Status: AC
  Administered 2012-07-08: 1750 mg via INTRAVENOUS
  Filled 2012-07-08: qty 1750

## 2012-07-08 MED ORDER — ONDANSETRON HCL 4 MG/2ML IJ SOLN
4.0000 mg | Freq: Four times a day (QID) | INTRAMUSCULAR | Status: DC | PRN
  Administered 2012-07-12: 4 mg via INTRAVENOUS
  Filled 2012-07-08: qty 2

## 2012-07-08 MED ORDER — PIPERACILLIN-TAZOBACTAM IN DEX 2-0.25 GM/50ML IV SOLN
2.2500 g | Freq: Three times a day (TID) | INTRAVENOUS | Status: DC
  Administered 2012-07-08 – 2012-07-10 (×7): 2.25 g via INTRAVENOUS
  Filled 2012-07-08 (×8): qty 50

## 2012-07-08 NOTE — Progress Notes (Signed)
CRITICAL VALUE ALERT  Critical value received:  CK 29.13, MB 76.2  Date of notification:  .07-08-12  Time of notification:  0720  Critical value read back:yes  Nurse who received alert:  Neville Route, RN   MD notified (1st page):  Rounding MD

## 2012-07-08 NOTE — Progress Notes (Signed)
ANTICOAGULATION CONSULT NOTE - Follow Up Consult  Pharmacy Consult for : Heparin Indication:  ACS  Dosing Weight: 77 kg  Labs:  Recent Labs  07/10/12 1425  06/28/2012 0800  07/07/12 0415 07/07/12 0500 07/07/12 1500 07/08/12 0415 07/08/12 0625  HGB 10.8*  < > 10.2*  --   --  10.0*  --  8.2* 8.1*  HCT 32.5*  < > 30.8*  --   --  30.1*  --  25.2* 25.1*  PLT 186  < > 156  --   --  151  --  132* 146*  LABPROT 14.6  --   --   --   --   --   --   --   --   INR 1.16  --   --   --   --   --   --   --   --   HEPARINUNFRC 0.65  --  0.40  --  0.27*  --   --  0.29*  --   CREATININE 4.90*  < >  --   < >  --  1.87* 2.49* 3.49*  --   < > = values in this interval not displayed. Lab Results  Component Value Date   INR 1.16 10-Jul-2012   Medications:  Prescriptions prior to admission  Medication Sig Dispense Refill  . amLODipine (NORVASC) 10 MG tablet Take 10 mg by mouth daily.      Marland Kitchen aspirin 325 MG tablet Take 325 mg by mouth daily.      Marland Kitchen atorvastatin (LIPITOR) 40 MG tablet Take 40 mg by mouth daily.      Marland Kitchen b complex-vitamin c-folic acid (NEPHRO-VITE) 0.8 MG TABS Take 0.8 mg by mouth at bedtime.      . carvedilol (COREG) 25 MG tablet Take 25 mg by mouth 2 (two) times daily with a meal.      . cloNIDine (CATAPRES) 0.1 MG tablet Take 0.1 mg by mouth 3 (three) times daily.      . clopidogrel (PLAVIX) 75 MG tablet Take 75 mg by mouth daily.      . ferrous sulfate 325 (65 FE) MG tablet Take 325 mg by mouth daily with breakfast.      . furosemide (LASIX) 40 MG tablet Take 40 mg by mouth.      Marland Kitchen glimepiride (AMARYL) 4 MG tablet Take 4 mg by mouth daily before breakfast.      . insulin glargine (LANTUS) 100 UNIT/ML injection Inject 12 Units into the skin at bedtime.      . paricalcitol (ZEMPLAR) 1 MCG capsule Take 1 mcg by mouth daily.      . sodium bicarbonate 650 MG tablet Take 650 mg by mouth 4 (four) times daily.       Assessment: 67 y/o AA female transferred to [facility] for  treatment/evaluation of Acute MI.  She continues on IV heparin with heparin level this morning of 0.29 despite rate increase.  She is just below desired goal range with her anticoagulation dosing.  Her CBC is stable as well as her platelets although both are lower than normal and there has been no noted bleeding complications.  Goal of therapy: Heparin level 0.3-0.7 units/ml Monitor platelets by anticoagulation protocol: Yes   Plan:  1.  Will increase Heparin rate slightly to 1050 units/hr. 2.  F/U AM heparin level since she is close to desired goal 3.  Monitor for s/s of bleeding  Nadara Mustard, PharmD., MS Clinical Pharmacist Pager:  [phone] Thank you for allowing pharmacy to be part of this patients care team. 07/08/2012, 7:24 AM

## 2012-07-08 NOTE — Progress Notes (Signed)
Patient ID: Barbara Montoya, female   DOB: 01/10/1946, 67 y.o.   MRN: [id number] I received extensive sign out from Dr. Adolm Joseph. Notes reviewed and patient examined and spoken to. Currently he is holding a blood pressure of 100/55. Heart rates 111 status 93%. CoOx  improved to 75 with a combination of low-dose Levophed and milrinone. Daughter in the room and is they are in [place]. She is aware of the current plan and also the tenuous nature of her mother. Crystal RN at bedside and very attentive and aware of her overall situation. Hopefully we can avoid reintubation. She is requiring a rebreather at the present time, otherwise she desats into the 70s. Critical care medicine involved.

## 2012-07-08 NOTE — Progress Notes (Signed)
1. Chronic kidney disease stage V now with likely progression to end-stage renal disease .  2. Acute myocardial infarction:  3. Anemia:  4. S/P Ventilator dependent respiratory failure  Plan: No indication for dialysis today.  BP low. CVP low. CXR not bad. May benefit from PRBCs.   Subjective: Interval History: Extubated  Objective: Vital signs in last 24 hours: Temp:  [98.3 F (36.8 C)-99.2 F (37.3 C)] 99.2 F (37.3 C) (06/28 1200) Pulse Rate:  [109-132] 109 (06/28 1230) Resp:  [17-45] 23 (06/28 1230) BP: (74-124)/(46-69) 94/54 mmHg (06/28 1230) SpO2:  [73 %-99 %] 93 % (06/28 1230) FiO2 (%):  [50 %-100 %] 100 % (06/28 1230) Weight:  [83 kg (182 lb 15.7 oz)] 83 kg (182 lb 15.7 oz) (06/28 0500) Weight change: 0.9 kg (1 lb 15.8 oz)  Intake/Output from previous day: 06/27 0701 - 06/28 0700 In: 2489.4 [P.O.:240; I.V.:1329.4; NG/GT:120; IV Piggyback:800] Out: 259 [Urine:129] Intake/Output this shift: Total I/O In: 517 [P.O.:180; I.V.:287; IV Piggyback:50] Out: 68 [Urine:68]  General appearance: alert and cooperative Chest wall: no tenderness Cardio: regular rate and rhythm, S1, S2 normal, no murmur, click, rub or gallop GI: soft, non-tender; bowel sounds normal; no masses,  no organomegaly Extremities: edema 1+  Lab Results:  Recent Labs  07/08/12 0415 07/08/12 0625  WBC 15.7* 17.0*  HGB 8.2* 8.1*  HCT 25.2* 25.1*  PLT 132* 146*   BMET:  Recent Labs  07/07/12 1500 07/08/12 0415  NA 134* 136  K 3.9 4.0  CL 99 101  CO2 25 23  GLUCOSE 201* 133*  BUN 25* 38*  CREATININE 2.49* 3.49*  CALCIUM 8.5 9.2   No results found for this basename: PTH,  in the last 72 hours Iron Studies:  Recent Labs  06/19/2012 1900  IRON 22*  TIBC 119*  FERRITIN 1139*   Studies/Results: Dg Chest Port 1 View  07/08/2012   *RADIOLOGY REPORT*  Clinical Data: Infiltrates  PORTABLE CHEST - 1 VIEW  Comparison: 2012/07/31  Findings: Endotracheal and NG tubes removed.  Bilateral internal  jugular central venous catheters stable.  Bibasilar hypoaeration is unchanged.  No pneumothorax.  IMPRESSION: Extubated.  Stable bibasilar atelectasis.   Original Report Authenticated By: Jolaine Click, M.D.   Scheduled: . antiseptic oral rinse  15 mL Mouth Rinse BID  . aspirin  325 mg Oral Daily  . atorvastatin  80 mg Oral q1800  . ferrous sulfate  325 mg Oral Q breakfast  . insulin aspart  2-6 Units Subcutaneous Q4H  . multivitamin  1 tablet Oral Daily  . pantoprazole  40 mg Oral Daily  . paricalcitol  1 mcg Oral Daily  . piperacillin-tazobactam (ZOSYN)  IV  2.25 g Intravenous Q8H     LOS: 3 days   Barbara Montoya C 07/08/2012,1:02 PM

## 2012-07-09 ENCOUNTER — Inpatient Hospital Stay (HOSPITAL_COMMUNITY): Payer: Medicare Other

## 2012-07-09 DIAGNOSIS — R570 Cardiogenic shock: Secondary | ICD-10-CM

## 2012-07-09 DIAGNOSIS — J9600 Acute respiratory failure, unspecified whether with hypoxia or hypercapnia: Secondary | ICD-10-CM

## 2012-07-09 DIAGNOSIS — I219 Acute myocardial infarction, unspecified: Secondary | ICD-10-CM

## 2012-07-09 LAB — CBC
HCT: 26.8 % — ABNORMAL LOW (ref 36.0–46.0)
Hemoglobin: 8.8 g/dL — ABNORMAL LOW (ref 12.0–15.0)
MCH: 22.9 pg — ABNORMAL LOW (ref 26.0–34.0)
MCHC: 32.8 g/dL (ref 30.0–36.0)
RDW: 15.9 % — ABNORMAL HIGH (ref 11.5–15.5)

## 2012-07-09 LAB — CULTURE, BLOOD (SINGLE)

## 2012-07-09 LAB — RENAL FUNCTION PANEL
Albumin: 2.2 g/dL — ABNORMAL LOW (ref 3.5–5.2)
Albumin: 2.3 g/dL — ABNORMAL LOW (ref 3.5–5.2)
BUN: 59 mg/dL — ABNORMAL HIGH (ref 6–23)
Calcium: 9.8 mg/dL (ref 8.4–10.5)
Chloride: 96 mEq/L (ref 96–112)
GFR calc Af Amer: 12 mL/min — ABNORMAL LOW (ref >90)
GFR calc non Af Amer: 10 mL/min — ABNORMAL LOW (ref >90)
Glucose, Bld: 169 mg/dL — ABNORMAL HIGH (ref 70–99)
Phosphorus: 5.1 mg/dL — ABNORMAL HIGH (ref 2.3–4.6)
Potassium: 3.9 mEq/L (ref 3.5–5.1)
Sodium: 132 mEq/L — ABNORMAL LOW (ref 135–145)

## 2012-07-09 LAB — GLUCOSE, CAPILLARY
Glucose-Capillary: 173 mg/dL — ABNORMAL HIGH (ref 70–99)
Glucose-Capillary: 140 mg/dL — ABNORMAL HIGH (ref 70–99)

## 2012-07-09 LAB — TYPE AND SCREEN
ABO/RH(D): B POS
Unit division: 0

## 2012-07-09 LAB — POCT I-STAT 3, ART BLOOD GAS (G3+)
O2 Saturation: 95 %
pCO2 arterial: 27.7 mmHg — ABNORMAL LOW (ref 35.0–45.0)
pH, Arterial: 7.387 (ref 7.350–7.450)

## 2012-07-09 MED ORDER — LIDOCAINE HCL (CARDIAC) 20 MG/ML IV SOLN
INTRAVENOUS | Status: AC
  Filled 2012-07-09: qty 5

## 2012-07-09 MED ORDER — PRISMASOL BGK 0/2.5 32-2.5 MEQ/L IV SOLN: INTRAVENOUS | Status: DC

## 2012-07-09 MED ORDER — PRISMASOL BGK 0/2.5 32-2.5 MEQ/L IV SOLN
INTRAVENOUS | Status: DC
  Administered 2012-07-09 (×2): via INTRAVENOUS_CENTRAL
  Filled 2012-07-09 (×5): qty 5000

## 2012-07-09 MED ORDER — PRISMASOL BGK 0/2.5 32-2.5 MEQ/L IV SOLN
INTRAVENOUS | Status: DC
  Filled 2012-07-09 (×2): qty 5000

## 2012-07-09 MED ORDER — ETOMIDATE 2 MG/ML IV SOLN
INTRAVENOUS | Status: AC
  Filled 2012-07-09: qty 20

## 2012-07-09 MED ORDER — MIDAZOLAM HCL 2 MG/2ML IJ SOLN
INTRAMUSCULAR | Status: AC
  Filled 2012-07-09: qty 2

## 2012-07-09 MED ORDER — PRISMASOL BGK 0/2.5 32-2.5 MEQ/L IV SOLN
INTRAVENOUS | Status: DC
  Administered 2012-07-09: 08:00:00 via INTRAVENOUS_CENTRAL
  Filled 2012-07-09 (×5): qty 5000

## 2012-07-09 MED ORDER — PRISMASOL BGK 0/2.5 32-2.5 MEQ/L IV SOLN
INTRAVENOUS | Status: DC
  Administered 2012-07-09 – 2012-07-10 (×5): via INTRAVENOUS_CENTRAL
  Filled 2012-07-09 (×10): qty 5000

## 2012-07-09 MED ORDER — ROCURONIUM BROMIDE 50 MG/5ML IV SOLN
INTRAVENOUS | Status: AC
  Filled 2012-07-09: qty 2

## 2012-07-09 MED ORDER — VANCOMYCIN HCL IN DEXTROSE 1-5 GM/200ML-% IV SOLN
1000.0000 mg | INTRAVENOUS | Status: DC
  Administered 2012-07-09 – 2012-07-14 (×6): 1000 mg via INTRAVENOUS
  Filled 2012-07-09 (×7): qty 200

## 2012-07-09 MED ORDER — CLOPIDOGREL BISULFATE 75 MG PO TABS
75.0000 mg | ORAL_TABLET | Freq: Every day | ORAL | Status: DC
  Administered 2012-07-10 – 2012-07-15 (×6): 75 mg via ORAL
  Filled 2012-07-09 (×8): qty 1

## 2012-07-09 MED ORDER — LEVALBUTEROL HCL 0.63 MG/3ML IN NEBU: 0.6300 mg | INHALATION_SOLUTION | RESPIRATORY_TRACT | Status: DC | PRN

## 2012-07-09 MED ORDER — SUCCINYLCHOLINE CHLORIDE 20 MG/ML IJ SOLN
INTRAMUSCULAR | Status: AC
  Filled 2012-07-09: qty 1

## 2012-07-09 MED ORDER — SODIUM CHLORIDE 0.9 % FOR CRRT
INTRAVENOUS_CENTRAL | Status: DC | PRN
  Filled 2012-07-09: qty 1000

## 2012-07-09 MED ORDER — SODIUM CHLORIDE 0.9 % IV SOLN
INTRAVENOUS | Status: DC
  Administered 2012-07-09 – 2012-07-13 (×3): via INTRAVENOUS

## 2012-07-09 MED ORDER — FENTANYL CITRATE 0.05 MG/ML IJ SOLN
INTRAMUSCULAR | Status: AC
  Filled 2012-07-09: qty 2

## 2012-07-09 NOTE — Progress Notes (Signed)
Called to see patient due to increasing O2 demands.  She received 1 unit PRBCs yesterday for anemia.  Last PM became anxious and was placed on Bipap with adequate O2 sats at in mid 90's.  Around 5am her sats dropped to mid 80's and she was placed on 100% BiPAP and sats increased to 96%.  Will call Renal to proceed ASAP with CVVHD since she does not make urine.  ABG on 100% showed O2 78 with CO2 27 and ph 7.38.  No intubation needed at this time.

## 2012-07-09 NOTE — Progress Notes (Signed)
1. Chronic kidney disease stage V now progressed to end-stage renal disease .  2. Acute myocardial infarction with shock 3. Anemia s/p one unit PRBC yest 4. S/P Ventilator dependent respiratory failure w/ recurrent resp insufficiency Plan: Restart CVVHD and continue HD; she is dialysis dependent now   Subjective: Interval History: Placed on BiPAP overnight Objective: Vital signs in last 24 hours: Temp:  [98.2 F (36.8 C)-101.4 F (38.6 C)] 99.2 F (37.3 C) (06/29 0800) Pulse Rate:  [105-132] 107 (06/29 0900) Resp:  [18-45] 24 (06/29 0900) BP: (79-115)/(29-71) 106/57 mmHg (06/29 0900) SpO2:  [80 %-100 %] 96 % (06/29 0900) FiO2 (%):  [50 %-100 %] 70 % (06/29 0800) Weight:  [86.1 kg (189 lb 13.1 oz)] 86.1 kg (189 lb 13.1 oz) (06/29 0500) Weight change: 3.1 kg (6 lb 13.4 oz) Intake/Output from previous day: 06/28 0701 - 06/29 0700 In: 1985.4 [P.O.:480; I.V.:1017.9; Blood:337.5; IV Piggyback:150] Out: 160 [Urine:160] Intake/Output this shift: Total I/O In: 96.7 [I.V.:96.7] Out: -  General appearance: alert, cooperative and wearing BiPAP Resp: clear to auscultation bilaterally and anteriorly Chest wall: no tenderness Cardio: regular rate and rhythm, S1, S2 normal, no murmur, click, rub or gallop Extremities: edema tr Lab Results:  Recent Labs  07/08/12 0625 07/09/12 0319  WBC 17.0* 22.4*  HGB 8.1* 8.8*  HCT 25.1* 26.8*  PLT 146* 121*   BMET:  Recent Labs  07/08/12 1600 07/09/12 0320  NA 132* 132*  K 3.7 4.1  CL 97 97  CO2 22 19  GLUCOSE 170* 169*  BUN 50* 59*  CREATININE 4.25* 4.88*  CALCIUM 9.6 9.8   No results found for this basename: PTH,  in the last 72 hours Iron Studies: No results found for this basename: IRON, TIBC, TRANSFERRIN, FERRITIN,  in the last 72 hours Studies/Results: Dg Chest Port 1 View  07/09/2012   *RADIOLOGY REPORT*  Clinical Data: Shortness of breath  PORTABLE CHEST - 1 VIEW  Comparison: 1 day prior  Findings: Right IJ dialysis catheter  unchanged.  Left internal jugular line terminates at mid SVC.  Apical lordotic patient positioning. Cardiomegaly accentuated by AP portable technique.  Development of a small left pleural effusion. No pneumothorax.  Left base air space disease.  IMPRESSION: Development of left base air space disease and adjacent pleural fluid.  This airspace disease could represent atelectasis or infection/aspiration.  Asymmetric alveolar pulmonary edema felt less likely.   Original Report Authenticated By: Jeronimo Greaves, M.D.   Dg Chest Port 1 View  07/08/2012   *RADIOLOGY REPORT*  Clinical Data: Infiltrates  PORTABLE CHEST - 1 VIEW  Comparison: 06/23/2012  Findings: Endotracheal and NG tubes removed.  Bilateral internal jugular central venous catheters stable.  Bibasilar hypoaeration is unchanged.  No pneumothorax.  IMPRESSION: Extubated.  Stable bibasilar atelectasis.   Original Report Authenticated By: Jolaine Click, M.D.   Scheduled: . antiseptic oral rinse  15 mL Mouth Rinse BID  . aspirin  325 mg Oral Daily  . atorvastatin  80 mg Oral q1800  . ferrous sulfate  325 mg Oral Q breakfast  . insulin aspart  2-6 Units Subcutaneous Q4H  . multivitamin  1 tablet Oral Daily  . pantoprazole  40 mg Oral Daily  . paricalcitol  1 mcg Oral Daily  . piperacillin-tazobactam (ZOSYN)  IV  2.25 g Intravenous Q8H  . vancomycin  1,000 mg Intravenous Q24H    LOS: 4 days   Kaicen Desena C 07/09/2012,9:51 AM

## 2012-07-09 NOTE — Progress Notes (Signed)
PULMONARY  / CRITICAL CARE MEDICINE  Name: Barbara Montoya MRN: [id number] DOB: [birth year]    ADMISSION DATE:  07/04/2012 CONSULTATION DATE:  06/21/2012  REFERRING MD :  Kirke Corin PRIMARY SERVICE: Cardiology  CHIEF COMPLAINT:  NSTEMI  BRIEF PATIENT DESCRIPTION: 67 year old female admitted 6/25 from [facility] following NSTEMI, c/b respiratory failure and AKI. In cardiogenic shock, new dialysis requirement.   SIGNIFICANT EVENTS / STUDIES:  6/23 ECHO >>> EF 40% 6/24 Cath >>> Mid LAD 99% stenosis, Ramus intermedius 90% stenosis, Proximal RCA 70% stenosis, Mid RCA 50% stenosis, RPDA 100% stenosis, RPLS 90% stenosis .................................................................................................................................. 6/25 Admit to [facility] 6/25 TTE >> dilated LV, EF 25%, focal hypokinesis 6/29 Increased reqt for vasopressors, on CRRT, on BiPAP for refractory hypoxemia, CXR shows new LLL consolidation and effusion  LINES / TUBES: ETT 6/25 >>> L IJ TLC ([facility]) >>> R [place] HD cath >>>  CULTURES: BC ([facility])>>> 6/25 UC>>> negative  ANTIBIOTICS: Vanc 6/29 >> Zosyn 6/28 >>  SUBJECTIVE / Interval events: Increased WOB, requiring BIPAP  Pressor demands increasing CRRT  Remains alert/following commands   VITAL SIGNS: Temp:  [98 F (36.7 C)-101.4 F (38.6 C)] 98 F (36.7 C) (06/29 1200) Pulse Rate:  [95-132] 95 (06/29 1200) Resp:  [15-40] 21 (06/29 1200) BP: (56-121)/(29-71) 92/45 mmHg (06/29 1200) SpO2:  [80 %-100 %] 90 % (06/29 1200) FiO2 (%):  [50 %-100 %] 100 % (06/29 1200) Weight:  [86.1 kg (189 lb 13.1 oz)] 86.1 kg (189 lb 13.1 oz) (06/29 0500)  HEMODYNAMICS: CVP:  [11 mmHg-16 mmHg] 12 mmHg  VENTILATOR SETTINGS: Vent Mode:  [-]  FiO2 (%):  [50 %-100 %] 100 %  INTAKE / OUTPUT: Intake/Output     06/28 0701 - 06/29 0700 06/29 0701 - 06/30 0700   P.O. 480    I.V. (mL/kg) 1017.9 (11.8) 207.6 (2.4)   Blood 337.5    NG/GT     IV Piggyback 150 50   Total  Intake(mL/kg) 1985.4 (23.1) 257.6 (3)   Urine (mL/kg/hr) 160 (0.1) 35 (0.1)   Other  165 (0.4)   Total Output 160 200   Net +1825.4 +57.6          PHYSICAL EXAMINATION: General:  RASS 0, NAD on BiPAP Neuro:  PERRL, following commands, MAEs HEENT:  No scleral icterus Cardiovascular:  RRR s M Lungs:  CTA bilateral anterior Abdomen:  Round, soft, non-tender. Active BS Musculoskeletal:  MAE well and equal Skin:  Pink, warm, dry and intact  LABS:  Recent Labs Lab 06/24/2012 1425 07/08/2012 1603 06/14/2012 1952 06/22/2012 0152 06/22/2012 0500  07/07/12 0500  07/08/12 0415 07/08/12 0625 07/08/12 1600 07/09/12 0319 07/09/12 0320 07/09/12 0607  HGB 10.8*  --   --   --  10.2*  < > 10.0*  --  8.2* 8.1*  --  8.8*  --   --   WBC 18.8*  --   --   --  15.9*  < > 19.8*  --  15.7* 17.0*  --  22.4*  --   --   PLT 186  --   --   --  163  < > 151  --  132* 146*  --  121*  --   --   NA 139  --   --   --  136  < > 135  < > 136  --  132*  --  132*  --   K 3.1*  --   --   --  3.7  < > 3.9  < >  4.0  --  3.7  --  4.1  --   CL 99  --   --   --  99  < > 99  < > 101  --  97  --  97  --   CO2 21  --   --   --  24  < > 25  < > 23  --  22  --  19  --   GLUCOSE 91  --   --   --  201*  < > 173*  < > 133*  --  170*  --  169*  --   BUN 53*  --   --   --  35*  < > 19  < > 38*  --  50*  --  59*  --   CREATININE 4.90*  --   --   --  2.87*  < > 1.87*  < > 3.49*  --  4.25*  --  4.88*  --   CALCIUM 9.2  --   --   --  7.5*  < > 8.5  < > 9.2  --  9.6  --  9.8  --   MG 2.0  --   --   --   --   --   --   --   --   --   --   --   --   --   PHOS  --   --   --   --   --   < > 2.7  < > 4.3  --  5.1*  --  5.1*  --   AST 38*  --   --   --   --   --   --   --   --   --   --   --   --   --   ALT 13  --   --   --   --   --   --   --   --   --   --   --   --   --   ALKPHOS 63  --   --   --   --   --   --   --   --   --   --   --   --   --   BILITOT 0.2*  --   --   --   --   --   --   --   --   --   --   --   --   --   PROT 6.6  --    --   --   --   --   --   --   --   --   --   --   --   --   ALBUMIN 2.4*  --   --   --   --   < > 2.4*  < > 2.2*  --  2.2*  --  2.2*  --   INR 1.16  --   --   --   --   --   --   --   --   --   --   --   --   --   TROPONINI 9.19*  --  10.83* 8.96*  --   --   --   --   --  17.43*  --   --   --   --  PROCALCITON 5.33  --   --   --  3.22  --  3.03  --   --   --   --   --   --   --   PROBNP 49043.0*  --   --   --   --   --   --   --   --   --   --   --   --   --   PHART  --  7.464*  --   --   --   --   --   --   --   --   --   --   --  7.387  PCO2ART  --  27.8*  --   --   --   --   --   --   --   --   --   --   --  27.7*  PO2ART  --  79.0*  --   --   --   --   --   --   --   --   --   --   --  78.0*  < > = values in this interval not displayed.  Recent Labs Lab 07/08/12 1236 07/08/12 1637 07/08/12 1921 07/08/12 2343 07/09/12 0422  GLUCAP 211* 154* 164* 133* 173*    CXR: 6/29 >  Development of left base air space disease and adjacent pleural  fluid.  ASSESSMENT / PLAN:  PULMONARY A:  Acute respiratory failure - due to pulmonary edema LL Opacity - favor  infiltrate, w/ fever and rising wbc  6/29>increased WOB w/ more fio2 demands requiring BIPAP support  P:   -  Continue on BIPAP , consider if intubation if sats if WOB worsens w/ hypoxia  - follow CXR in am, if able upright film  -pulm hygiene w/ xopenex as needed.  -Cont on IV abx    CARDIOVASCULAR A:  NSTEMI Shock, presumed cardiogenic; LVEF 25%, dilated LV Coronary Artery Disease multivessel disease evident on cath, no change on repeat at [facility] 6/25 Hyperlipidemia chronic Hx HTN  6/29> increasing pressor demands w/ Levophed  P:  - titrate Levophed for MAP >65 - appreciate Dr vanTrigt's eval, not a candidate for CABG at this time. Consider PTCI instead when more stable  - ASA, lipitor, heparin  RENAL A:   Stage V CKD baseline Cr 4.6 P:   - CRRT for volume removal - Avoid nephrotoxic medications -renal following    GASTROINTESTINAL A:  No active issues P:   - NPO for now on BIPAP  - Protonix for SUP  HEMATOLOGIC A:   Leukocytosis d/t stres/MI / infxn  Coagulopathy due to heparin infusion   Iron deficiency anemia chronic >>S/p 1 u PRBC 6/28  P:  - Continue ferrous sulfate - Follow CBC - Heparin, no need for SCD's while on gtt  INFECTIOUS A:   Leukocytosis Maia Plan /LLL infiltrate suspect new left sided PNA  P:   - Follow WBC - BC pending  - cont vanc /zosyn  -check PCT in am   ENDOCRINE A:   Type 2 DM with hyperglycemia   P:   - CBG q4h - SSI  NEUROLOGIC A:   Acute encephalopathy due to critical illness >improved  P:    monitor   TODAY'S SUMMARY: Mrs. Manchester presents following NSTEMI with significant CAD. EF 25% She also has baseline CKD stage V.  . On CRRT for volume removal.   Cardiology guiding  either PTCI or CABG. Has multi system failure but remains alert and coherent. Updated daughter on condition. Continue on BIPAP  If worsens will require intubation.   Discussed at length with pt and family and with Dr Daleen Squibb and Dr Graciela Husbands. Difficult situation as she has 3v CAD with poor options for revascularization. She has hypoxic RF and is @ high risk for intubation. On CRRT with what is likely to be ESRD. Her chance of functional recovery is very small. On the other hand, her cognition appears to be intact to extent that it can be evaluated. She seems to indicate that she would wish to undergo short term intubation if resp failure progresses. I explained to her and family that ACLS/CPR would not be of any benefit if she were to suffer cardiac arrest and we all agree that these measures will not be undertaken.   CRITICAL CARE: The patient is critically ill with multiple organ systems failure and requires high complexity decision making for assessment and support, frequent evaluation and titration of therapies, application of advanced monitoring technologies and extensive interpretation of  multiple databases. Critical Care Time devoted to patient care services described in this note is 45 minutes.   Billy Fischer, MD ; [facility] [phone].  After 5:30 PM or weekends, call [phone]

## 2012-07-09 NOTE — Progress Notes (Signed)
Patient ID: Barbara Montoya, female   DOB: 11-05-1945, 67 y.o.   MRN: [id number]   Patient Name: Barbara Montoya Date of Encounter: 07/09/2012    SUBJECTIVE  Events of the evening noted. Signed out with Dr. Mayford Knife. Currently on  CWHD with systolic pressures in the 90s. Heart rate still about 110. Remains on combination of milrinone and Levophed. O2 sats with a rebreather 99%. He is currently on 70% FiO2. Rinaldo Cloud RN at bedside.  CURRENT MEDS . antiseptic oral rinse  15 mL Mouth Rinse BID  . aspirin  325 mg Oral Daily  . atorvastatin  80 mg Oral q1800  . ferrous sulfate  325 mg Oral Q breakfast  . insulin aspart  2-6 Units Subcutaneous Q4H  . multivitamin  1 tablet Oral Daily  . pantoprazole  40 mg Oral Daily  . paricalcitol  1 mcg Oral Daily  . piperacillin-tazobactam (ZOSYN)  IV  2.25 g Intravenous Q8H  . vancomycin  1,000 mg Intravenous Q24H    OBJECTIVE  Filed Vitals:   07/09/12 0700 07/09/12 0731 07/09/12 0800 07/09/12 0900  BP: 105/71 102/70 98/57 106/57  Pulse: 110 114 108 107  Temp:   99.2 F (37.3 C)   TempSrc:   Axillary   Resp: 30 25 25 24   Height:      Weight:      SpO2: 99% 95% 94% 96%    Intake/Output Summary (Last 24 hours) at 07/09/12 0928 Last data filed at 07/09/12 0900  Gross per 24 hour  Intake 1913.28 ml  Output    122 ml  Net 1791.28 ml   Filed Weights   07/07/12 0500 07/08/12 0500 07/09/12 0500  Weight: 181 lb (82.1 kg) 182 lb 15.7 oz (83 kg) 189 lb 13.1 oz (86.1 kg)    PHYSICAL EXAM  General: On nonrebreather, opens eyes and responds appropriately Neuro: oriented X 3. Moves all extremities spontaneously. Psych: Normal affect. HEENT:  Normal  Neck: Supple without bruits  Lungs:  Resp regular and unlabored, CTA. Heart: RRR no s3, s4, or murmurs. Abdomen: Soft, non-tender, non-distended, BS + x 4.  Extremities: No clubbing, cyanosis or edema. DP/PT/Radials 2+ and equal bilaterally.  Accessory Clinical Findings  CBC  Recent Labs  07/08/12 0625 07/09/12 0319  WBC 17.0* 22.4*  HGB 8.1* 8.8*  HCT 25.1* 26.8*  MCV 68.4* 69.6*  PLT 146* 121*   Basic Metabolic Panel  Recent Labs  07/08/12 1600 07/09/12 0320  NA 132* 132*  K 3.7 4.1  CL 97 97  CO2 22 19  GLUCOSE 170* 169*  BUN 50* 59*  CREATININE 4.25* 4.88*  CALCIUM 9.6 9.8  PHOS 5.1* 5.1*   Liver Function Tests  Recent Labs  07/08/12 1600 07/09/12 0320  ALBUMIN 2.2* 2.2*   No results found for this basename: LIPASE, AMYLASE,  in the last 72 hours Cardiac Enzymes  Recent Labs  07/08/12 0625  CKTOTAL 881*  CKMB 76.2*  TROPONINI 17.43*   BNP No components found with this basename: POCBNP,  D-Dimer No results found for this basename: DDIMER,  in the last 72 hours Hemoglobin A1C No results found for this basename: HGBA1C,  in the last 72 hours Fasting Lipid Panel No results found for this basename: CHOL, HDL, LDLCALC, TRIG, CHOLHDL, LDLDIRECT,  in the last 72 hours Thyroid Function Tests No results found for this basename: TSH, T4TOTAL, FREET3, T3FREE, THYROIDAB,  in the last 72 hours  TELE Sinus tachycardia  ECG    Radiology/Studies  Dg Chest [facility]  1 View  07/09/2012   *RADIOLOGY REPORT*  Clinical Data: Shortness of breath  PORTABLE CHEST - 1 VIEW  Comparison: 1 day prior  Findings: Right IJ dialysis catheter unchanged.  Left internal jugular line terminates at mid SVC.  Apical lordotic patient positioning. Cardiomegaly accentuated by AP portable technique.  Development of a small left pleural effusion. No pneumothorax.  Left base air space disease.  IMPRESSION: Development of left base air space disease and adjacent pleural fluid.  This airspace disease could represent atelectasis or infection/aspiration.  Asymmetric alveolar pulmonary edema felt less likely.   Original Report Authenticated By: Jeronimo Greaves, M.D.   Dg Chest Port 1 View  07/08/2012   *RADIOLOGY REPORT*  Clinical Data: Infiltrates  PORTABLE CHEST - 1 VIEW  Comparison:  07/09/2012  Findings: Endotracheal and NG tubes removed.  Bilateral internal jugular central venous catheters stable.  Bibasilar hypoaeration is unchanged.  No pneumothorax.  IMPRESSION: Extubated.  Stable bibasilar atelectasis.   Original Report Authenticated By: Jolaine Click, M.D.   Dg Chest Port 1 View  07/05/2012   *RADIOLOGY REPORT*  Clinical Data: Check ET tube position.Respiratory failure.  PORTABLE CHEST - 1 VIEW  Comparison: 2012/07/08  Findings: Support devices are unchanged.  Bibasilar atelectasis or infiltrates, similar to prior study.  Improving aeration in the left upper lobe.  Suspect trace effusions.  Heart is normal size.  IMPRESSION: Improving aeration in the left upper lobe.  Bibasilar atelectasis, trace effusions.   Original Report Authenticated By: Charlett Nose, M.D.   Dg Chest Port 1 View  07-08-12   *RADIOLOGY REPORT*  Clinical Data: Intubation, central line placement, hypertension, diabetes  PORTABLE CHEST - 1 VIEW  Comparison: None.  Findings: Endotracheal tube 6 cm above the carina.  Right IJ dialysis catheter tips in the SVC.  Left IJ central line tip in the mid SVC as well.  Normal heart size and vascularity.  Ill-defined increased opacity throughout the left central lung could represent developing airspace disease versus asymmetric edema.  Right lung relatively clear.  No effusion or pneumothorax. NG tube extends below the hemidiaphragms the tip not visualized.  IMPRESSION: Support apparatus in good position.  No pneumothorax  Ill-defined increased opacity left central lung could represent developing airspace process or asymmetric edema   Original Report Authenticated By: Judie Petit. Shick, M.D.    ASSESSMENT AND PLAN  Principal Problem:   Myocardial infarction Active Problems:   Hypertension   Diabetes mellitus   CKD (chronic kidney disease), stage V   Acute respiratory failure   Acute pulmonary edema   Altered mental status   Hypotension   Cardiomyopathy, ischemic     Clinical status remains very tenuous. Hemoglobin has increased to 8.8 her chest x-ray is worrisome for infection her white count is elevated. Hemodynamics borderline but about the same as yesterday. Tolerating CWHD at this time.  Family not present this morning. They do understand the clinical circumstances and are realistic. We'll make sure the critical care medicine sees her today.  Signed, Valera Castle MD

## 2012-07-09 NOTE — Progress Notes (Addendum)
ANTICOAGULATION CONSULT NOTE - Follow Up Consult  Pharmacy Consult for : Heparin Indication:  ACS  Dosing Weight: 77 kg  Labs:  Recent Labs  07/07/12 0415  07/08/12 0415 07/08/12 0625 07/08/12 1600 07/09/12 0319 07/09/12 0320  HGB  --   < > 8.2* 8.1*  --  8.8*  --   HCT  --   < > 25.2* 25.1*  --  26.8*  --   PLT  --   < > 132* 146*  --  121*  --   HEPARINUNFRC 0.27*  --  0.29*  --   --  0.39  --   CREATININE  --   < > 3.49*  --  4.25*  --  4.88*  < > = values in this interval not displayed. Lab Results  Component Value Date   INR 1.16 07/01/2012   Medications:  Prescriptions prior to admission  Medication Sig Dispense Refill  . amLODipine (NORVASC) 10 MG tablet Take 10 mg by mouth daily.      Marland Kitchen aspirin 325 MG tablet Take 325 mg by mouth daily.      Marland Kitchen atorvastatin (LIPITOR) 40 MG tablet Take 40 mg by mouth daily.      Marland Kitchen b complex-vitamin c-folic acid (NEPHRO-VITE) 0.8 MG TABS Take 0.8 mg by mouth at bedtime.      . carvedilol (COREG) 25 MG tablet Take 25 mg by mouth 2 (two) times daily with a meal.      . cloNIDine (CATAPRES) 0.1 MG tablet Take 0.1 mg by mouth 3 (three) times daily.      . clopidogrel (PLAVIX) 75 MG tablet Take 75 mg by mouth daily.      . ferrous sulfate 325 (65 FE) MG tablet Take 325 mg by mouth daily with breakfast.      . furosemide (LASIX) 40 MG tablet Take 40 mg by mouth.      Marland Kitchen glimepiride (AMARYL) 4 MG tablet Take 4 mg by mouth daily before breakfast.      . insulin glargine (LANTUS) 100 UNIT/ML injection Inject 12 Units into the skin at bedtime.      . paricalcitol (ZEMPLAR) 1 MCG capsule Take 1 mcg by mouth daily.      . sodium bicarbonate 650 MG tablet Take 650 mg by mouth 4 (four) times daily.       Assessment: 67 y/o AA female transferred to [facility] for treatment/evaluation of Acute MI.  She continues on IV heparin with heparin level this morning of 0.39 which is within the desired goal range.  She received 1 unit PRBC yesterday for her  anemia and her H/H is slightly up from 8.1>>8.8 and 25.1>>26.8.  Her platelets are 121K and down slightly but no noted bleeding complications.    Goal of therapy: Heparin level 0.3-0.7 units/ml Monitor platelets by anticoagulation protocol: Yes   Plan:  1.  Will continue current heparin at rate of  1050 units/hr. 2.  F/U AM heparin level 3.  Monitor for s/s of bleeding  Nadara Mustard, PharmD., MS Clinical Pharmacist Pager:  [phone] Thank you for allowing pharmacy to be part of this patients care team. 07/09/2012, 7:04 AM   Addendum:  Spoke with Dr. Daleen Squibb regarding patients home med (Clopidogrel).  I also questioned the patient as to whether she took it at home.  She states that she thinks she does.  She has a history of CVA which may be the indication for this.  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  Due to the fact that she has had an MI, we will continue her Plavix for now.  Thank you,  Nadara Mustard, PharmD.

## 2012-07-09 NOTE — Progress Notes (Signed)
Pt unable to maintain O2 sat above 90%. Called Dr. Mayford Knife. New orders for ABG and CXR. Increased FiO2 to 100% on bipap. Also contacted on call renal MD and CCM. Per Dr. Lowell Guitar restart CRRT.

## 2012-07-09 NOTE — Progress Notes (Signed)
PT Cancellation Note  Patient Details Name: Barbara Montoya MRN: [id number] DOB: Aug 05, 1945   Cancelled Treatment:    Reason Eval/Treat Not Completed: Medical issues which prohibited therapy.  Patient with declining resp status, and is on CWHD.  Will hold PT today and check on patient at later date for PT eval as appropriate.   Vena Austria 07/09/2012, 8:26 AM Durenda Hurt. Renaldo Fiddler, [facility] Acute Rehab Services Pager [phone]

## 2012-07-09 NOTE — Progress Notes (Signed)
Spoke with Dr. Sung Amabile regarding patients AM medications. Hold all PO meds until he arrives to assess. Will continue to assess and monitor.

## 2012-07-10 ENCOUNTER — Inpatient Hospital Stay (HOSPITAL_COMMUNITY): Payer: Medicare Other

## 2012-07-10 DIAGNOSIS — I2510 Atherosclerotic heart disease of native coronary artery without angina pectoris: Secondary | ICD-10-CM

## 2012-07-10 DIAGNOSIS — R570 Cardiogenic shock: Secondary | ICD-10-CM

## 2012-07-10 LAB — RENAL FUNCTION PANEL
Albumin: 2.3 g/dL — ABNORMAL LOW (ref 3.5–5.2)
BUN: 28 mg/dL — ABNORMAL HIGH (ref 6–23)
CO2: 21 mEq/L (ref 19–32)
CO2: 22 mEq/L (ref 19–32)
Calcium: 9.2 mg/dL (ref 8.4–10.5)
Calcium: 9.4 mg/dL (ref 8.4–10.5)
Chloride: 99 mEq/L (ref 96–112)
Creatinine, Ser: 3.05 mg/dL — ABNORMAL HIGH (ref 0.50–1.10)
Creatinine, Ser: 2.06 mg/dL — ABNORMAL HIGH (ref 0.50–1.10)
GFR calc Af Amer: 17 mL/min — ABNORMAL LOW (ref >90)
GFR calc non Af Amer: 15 mL/min — ABNORMAL LOW (ref >90)
Phosphorus: 3.9 mg/dL (ref 2.3–4.6)
Sodium: 133 mEq/L — ABNORMAL LOW (ref 135–145)

## 2012-07-10 LAB — HEPARIN LEVEL (UNFRACTIONATED)
Heparin Unfractionated: 0.24 IU/mL — ABNORMAL LOW (ref 0.30–0.70)
Heparin Unfractionated: 0.29 IU/mL — ABNORMAL LOW (ref 0.30–0.70)

## 2012-07-10 LAB — MAGNESIUM: Magnesium: 2.5 mg/dL (ref 1.5–2.5)

## 2012-07-10 LAB — GLUCOSE, CAPILLARY
Glucose-Capillary: 150 mg/dL — ABNORMAL HIGH (ref 70–99)
Glucose-Capillary: 135 mg/dL — ABNORMAL HIGH (ref 70–99)
Glucose-Capillary: 146 mg/dL — ABNORMAL HIGH (ref 70–99)
Glucose-Capillary: 175 mg/dL — ABNORMAL HIGH (ref 70–99)

## 2012-07-10 LAB — PROCALCITONIN: Procalcitonin: 4.18 ng/mL

## 2012-07-10 LAB — CBC
HCT: 25.8 % — ABNORMAL LOW (ref 36.0–46.0)
Hemoglobin: 8.8 g/dL — ABNORMAL LOW (ref 12.0–15.0)
MCHC: 34.1 g/dL (ref 30.0–36.0)
RBC: 3.75 MIL/uL — ABNORMAL LOW (ref 3.87–5.11)
WBC: 30.2 10*3/uL — ABNORMAL HIGH (ref 4.0–10.5)

## 2012-07-10 MED ORDER — DARBEPOETIN ALFA-POLYSORBATE 150 MCG/0.3ML IJ SOLN
150.0000 ug | INTRAMUSCULAR | Status: DC
  Administered 2012-07-10: 150 ug via INTRAVENOUS
  Filled 2012-07-10 (×2): qty 0.3

## 2012-07-10 MED ORDER — POTASSIUM CHLORIDE 10 MEQ/100ML IV SOLN
10.0000 meq | INTRAVENOUS | Status: AC
  Administered 2012-07-10 (×4): 10 meq via INTRAVENOUS
  Filled 2012-07-10: qty 400

## 2012-07-10 MED ORDER — DARBEPOETIN ALFA-POLYSORBATE 150 MCG/0.3ML IJ SOLN
150.0000 ug | INTRAMUSCULAR | Status: DC
  Filled 2012-07-10: qty 0.3

## 2012-07-10 MED ORDER — BOOST / RESOURCE BREEZE PO LIQD
1.0000 | Freq: Three times a day (TID) | ORAL | Status: DC
  Administered 2012-07-10 – 2012-07-14 (×5): 1 via ORAL

## 2012-07-10 MED ORDER — PRISMASOL BGK 4/2.5 32-4-2.5 MEQ/L IV SOLN
INTRAVENOUS | Status: DC
  Administered 2012-07-10 – 2012-07-15 (×30): via INTRAVENOUS_CENTRAL
  Filled 2012-07-10 (×46): qty 5000

## 2012-07-10 MED ORDER — PRISMASOL BGK 4/2.5 32-4-2.5 MEQ/L IV SOLN
INTRAVENOUS | Status: DC
  Filled 2012-07-10: qty 5000

## 2012-07-10 MED ORDER — FERUMOXYTOL INJECTION 510 MG/17 ML
510.0000 mg | Freq: Once | INTRAVENOUS | Status: AC
  Administered 2012-07-10: 510 mg via INTRAVENOUS
  Filled 2012-07-10: qty 17

## 2012-07-10 MED ORDER — PRISMASOL BGK 4/2.5 32-4-2.5 MEQ/L IV SOLN
INTRAVENOUS | Status: DC
  Administered 2012-07-10 – 2012-07-14 (×5): via INTRAVENOUS_CENTRAL
  Filled 2012-07-10 (×8): qty 5000

## 2012-07-10 MED ORDER — PIPERACILLIN-TAZOBACTAM 3.375 G IVPB 30 MIN
3.3750 g | Freq: Four times a day (QID) | INTRAVENOUS | Status: DC
  Administered 2012-07-10 – 2012-07-15 (×21): 3.375 g via INTRAVENOUS
  Filled 2012-07-10 (×23): qty 50

## 2012-07-10 MED ORDER — PRISMASOL BGK 4/2.5 32-4-2.5 MEQ/L IV SOLN
INTRAVENOUS | Status: DC
  Administered 2012-07-10 – 2012-07-15 (×18): via INTRAVENOUS_CENTRAL
  Filled 2012-07-10 (×24): qty 5000

## 2012-07-10 MED ORDER — ALTEPLASE 2 MG IJ SOLR
2.0000 mg | Freq: Once | INTRAMUSCULAR | Status: AC
  Administered 2012-07-10: 2 mg
  Filled 2012-07-10: qty 2

## 2012-07-10 MED ORDER — SODIUM CHLORIDE 0.9 % IJ SOLN
10.0000 mL | INTRAMUSCULAR | Status: DC | PRN
  Administered 2012-07-10: 10 mL

## 2012-07-10 MED ORDER — SODIUM CHLORIDE 0.9 % IJ SOLN
10.0000 mL | Freq: Two times a day (BID) | INTRAMUSCULAR | Status: DC
  Administered 2012-07-11 – 2012-07-14 (×3): 10 mL
  Administered 2012-07-14: 30 mL

## 2012-07-10 NOTE — Progress Notes (Signed)
NUTRITION FOLLOW UP  Intervention:   1. Resource Breeze po TID, each supplement provides 250 kcal and 9 grams of protein.  2. If pt is unable to meet nutrition needs with oral intake soon, recommend restart enteral nutrition  Nutrition Dx:   Inadequate oral intake now related to poor appetite as evidenced by RN report of refusing meals  Goal:   Meet >/=90% estimated nutrition needs. Unmet  Monitor:   PO intake, weight trends, labs, I/O's  Assessment:   Pt is now extubated, OG tube out. CVVHD was stopped and planned to transition to IHD but pt developed increased O2 demands and was restarted on CVVHD. Was unable to eat yesterday related to being on Bipap. Today RN reports pt is not hungry and refused everything except a few sips.  RD will add oral nutrition supplements. If intake does not improve quickly, recommend starting enteral nutrition to meet increased nutrition needs.   Height: Ht Readings from Last 1 Encounters:  06/12/2012 5\' 6"  (1.676 m)    Weight Status:   Wt Readings from Last 1 Encounters:  07/10/12 183 lb 13.8 oz (83.4 kg)  Body mass index is 29.69 kg/(m^2).   Re-estimated needs:  Kcal: 2000-2200 Protein: 115-125 gm  Fluid: per MD  Skin: intact   Diet Order: Renal   Intake/Output Summary (Last 24 hours) at 07/10/12 1152 Last data filed at 07/10/12 1100  Gross per 24 hour  Intake 2013.51 ml  Output   2911 ml  Net -897.49 ml    Last BM: 6/29   Labs:   Recent Labs Lab 06/30/2012 1425  07/09/12 0320 07/09/12 1600 07/10/12 0420  NA 139  < > 132* 132* 133*  K 3.1*  < > 4.1 3.9 3.4*  CL 99  < > 97 96 95*  CO2 21  < > 19 21 21   BUN 53*  < > 59* 52* 39*  CREATININE 4.90*  < > 4.88* 4.24* 3.05*  CALCIUM 9.2  < > 9.8 9.1 9.2  MG 2.0  --   --   --  2.5  PHOS  --   < > 5.1* 4.9* 3.9  GLUCOSE 91  < > 169* 149* 151*  < > = values in this interval not displayed.  CBG (last 3)   Recent Labs  07/09/12 2340 07/10/12 0359 07/10/12 0824  GLUCAP 120*  150* 135*    Scheduled Meds: . antiseptic oral rinse  15 mL Mouth Rinse BID  . aspirin  325 mg Oral Daily  . atorvastatin  80 mg Oral q1800  . clopidogrel  75 mg Oral Q breakfast  . darbepoetin (ARANESP) injection - DIALYSIS  150 mcg Intravenous Q Mon-HD  . insulin aspart  2-6 Units Subcutaneous Q4H  . multivitamin  1 tablet Oral Daily  . pantoprazole  40 mg Oral Daily  . paricalcitol  1 mcg Oral Daily  . piperacillin-tazobactam  3.375 g Intravenous Q6H  . potassium chloride  10 mEq Intravenous Q1 Hr x 4  . vancomycin  1,000 mg Intravenous Q24H    Continuous Infusions: . sodium chloride 10 mL/hr at 07/10/12 0800  . sodium chloride Stopped (07/10/12 0100)  . sodium chloride Stopped (07/09/12 1642)  . heparin 1,200 Units/hr (07/10/12 1053)  . milrinone 0.25 mcg/kg/min (07/10/12 0800)  . norepinephrine (LEVOPHED) Adult infusion 21.333 mcg/min (07/10/12 1100)  . dialysis solution (prismaSATE) +/- additives    . dialysis solution (prismaSATE) +/- additives 800 mL/hr at 07/10/12 0952  . dialysis solution (prismaSATE) +/-  additives      Clarene Duke RD, LDN Pager [phone] After Hours pager [phone]

## 2012-07-10 NOTE — Progress Notes (Signed)
ANTICOAGULATION CONSULT NOTE - Follow Up Consult  Pharmacy Consult for : Heparin Indication:  ACS  Dosing Weight: 77 kg  Labs:  Recent Labs  07/08/12 0415 07/08/12 0625  07/09/12 0319 07/09/12 0320 07/09/12 1600 07/10/12 0420  HGB 8.2* 8.1*  --  8.8*  --   --  8.8*  HCT 25.2* 25.1*  --  26.8*  --   --  25.8*  PLT 132* 146*  --  121*  --   --  167  HEPARINUNFRC 0.29*  --   --  0.39  --   --  0.24*  CREATININE 3.49*  --   < >  --  4.88* 4.24* 3.05*  < > = values in this interval not displayed. Lab Results  Component Value Date   INR 1.16 06/16/2012   Assessment: 67 y/o female with CAD for heparin  Goal of therapy: Heparin level 0.3-0.7 units/ml Monitor platelets by anticoagulation protocol: Yes   Plan:  Increase Heparin 1200 units/hr Check heparin level in 8 hours.  Increase Zosyn 3.375 g IV q6h while on CVVH  Geannie Risen, PharmD, BCPS  07/10/2012, 5:04 AM

## 2012-07-10 NOTE — Progress Notes (Signed)
**Note De-Identified Kaydyn Sayas Obfuscation** RT note: patient removed from BIPAP @ 0600 and placed on 40% VM; patient tolerating well. SAT 98%

## 2012-07-10 NOTE — Progress Notes (Signed)
PULMONARY  / CRITICAL CARE MEDICINE  Name: Barbara Montoya MRN: [id number] DOB: 1945/02/18    ADMISSION DATE:  06/22/2012 CONSULTATION DATE:  06/21/2012  REFERRING MD :  Kirke Corin PRIMARY SERVICE: Cardiology  CHIEF COMPLAINT:  NSTEMI  BRIEF PATIENT DESCRIPTION: 67 year old female admitted 6/25 from [place] following NSTEMI, c/b respiratory failure and AKI. In cardiogenic shock, new dialysis requirement.   SIGNIFICANT EVENTS / STUDIES:  6/23 ECHO >>> EF 40% 6/24 Cath >>> Mid LAD 99% stenosis, Ramus intermedius 90% stenosis, Proximal RCA 70% stenosis, Mid RCA 50% stenosis, RPDA 100% stenosis, RPLS 90% stenosis .................................................................................................................................. 6/25 Admit to [facility] 6/25 TTE >> dilated LV, EF 25%, focal hypokinesis 6/29 Increased reqt for vasopressors, on CRRT, on BiPAP for refractory hypoxemia, CXR shows new LLL consolidation and effusion  LINES / TUBES: ETT 6/25 >>>6/28 L IJ TLC ([facility]) >>> R [facility] cath >>>  CULTURES: BC ([facility])>>> 6/25 UC>>> negative  ANTIBIOTICS: Vanc 6/29 >> Zosyn 6/28 >>  SUBJECTIVE / Interval events: Increased WOB, requiring BIPAP  Pressor demands increasing CRRT  Remains alert/following commands   VITAL SIGNS: Temp:  [97.4 F (36.3 C)-98 F (36.7 C)] 97.6 F (36.4 C) (06/30 0800) Pulse Rate:  [94-105] 99 (06/30 1000) Resp:  [15-36] 15 (06/30 1000) BP: (56-139)/(29-91) 111/38 mmHg (06/30 1000) SpO2:  [89 %-100 %] 94 % (06/30 1000) FiO2 (%):  [40 %-100 %] 40 % (06/30 0825) Weight:  [83.4 kg (183 lb 13.8 oz)] 83.4 kg (183 lb 13.8 oz) (06/30 0510)  HEMODYNAMICS: CVP:  [6 mmHg-18 mmHg] 6 mmHg  VENTILATOR SETTINGS: Vent Mode:  [-]  FiO2 (%):  [40 %-100 %] 40 %  INTAKE / OUTPUT: Intake/Output     06/29 0701 - 06/30 0700 06/30 0701 - 07/01 0700   P.O.  50   I.V. (mL/kg) 1317.5 (15.8) 134.2 (1.6)   Blood     IV Piggyback 354.3 200   Total Intake(mL/kg)  1671.8 (20) 384.2 (4.6)   Urine (mL/kg/hr) 95 (0) 6 (0)   Other 2267 (1.1) 560 (2)   Total Output 2362 566   Net -690.2 -181.8        Stool Occurrence 2 x      PHYSICAL EXAMINATION: General:  RASS 0, NAD on BiPAP Neuro:  PERRL, following commands, MAEs HEENT:  No scleral icterus Cardiovascular:  RRR s M Lungs:  CTA bilateral anterior Abdomen:  Round, soft, non-tender. Active BS Musculoskeletal:  MAE well and equal Skin:  Pink, warm, dry and intact  LABS:  Recent Labs Lab 06/21/2012 1425 06/23/2012 1603 06/22/2012 1952 07/05/2012 0152 06/29/2012 0500  07/07/12 0500  07/08/12 0625  07/09/12 0319 07/09/12 0320 07/09/12 0607 07/09/12 1600 07/10/12 0420  HGB 10.8*  --   --   --  10.2*  < > 10.0*  < > 8.1*  --  8.8*  --   --   --  8.8*  WBC 18.8*  --   --   --  15.9*  < > 19.8*  < > 17.0*  --  22.4*  --   --   --  30.2*  PLT 186  --   --   --  163  < > 151  < > 146*  --  121*  --   --   --  167  NA 139  --   --   --  136  < > 135  < >  --   < >  --  132*  --  132* 133*  K 3.1*  --   --   --  3.7  < > 3.9  < >  --   < >  --  4.1  --  3.9 3.4*  CL 99  --   --   --  99  < > 99  < >  --   < >  --  97  --  96 95*  CO2 21  --   --   --  24  < > 25  < >  --   < >  --  19  --  21 21  GLUCOSE 91  --   --   --  201*  < > 173*  < >  --   < >  --  169*  --  149* 151*  BUN 53*  --   --   --  35*  < > 19  < >  --   < >  --  59*  --  52* 39*  CREATININE 4.90*  --   --   --  2.87*  < > 1.87*  < >  --   < >  --  4.88*  --  4.24* 3.05*  CALCIUM 9.2  --   --   --  7.5*  < > 8.5  < >  --   < >  --  9.8  --  9.1 9.2  MG 2.0  --   --   --   --   --   --   --   --   --   --   --   --   --  2.5  PHOS  --   --   --   --   --   < > 2.7  < >  --   < >  --  5.1*  --  4.9* 3.9  AST 38*  --   --   --   --   --   --   --   --   --   --   --   --   --   --   ALT 13  --   --   --   --   --   --   --   --   --   --   --   --   --   --   ALKPHOS 63  --   --   --   --   --   --   --   --   --   --   --   --   --   --    BILITOT 0.2*  --   --   --   --   --   --   --   --   --   --   --   --   --   --   PROT 6.6  --   --   --   --   --   --   --   --   --   --   --   --   --   --   ALBUMIN 2.4*  --   --   --   --   < > 2.4*  < >  --   < >  --  2.2*  --  2.3* 2.3*  INR 1.16  --   --   --   --   --   --   --   --   --   --   --   --   --   --  TROPONINI 9.19*  --  10.83* 8.96*  --   --   --   --  17.43*  --   --   --   --   --   --   PROCALCITON 5.33  --   --   --  3.22  --  3.03  --   --   --   --   --   --   --  4.18  PROBNP 49043.0*  --   --   --   --   --   --   --   --   --   --   --   --   --   --   PHART  --  7.464*  --   --   --   --   --   --   --   --   --   --  7.387  --   --   PCO2ART  --  27.8*  --   --   --   --   --   --   --   --   --   --  27.7*  --   --   PO2ART  --  79.0*  --   --   --   --   --   --   --   --   --   --  78.0*  --   --   < > = values in this interval not displayed.  Recent Labs Lab 07/09/12 1654 07/09/12 2026 07/09/12 2340 07/10/12 0359 07/10/12 0824  GLUCAP 140* 180* 120* 150* 135*   CXR: 6/29 >  Development of left base air space disease and adjacent pleural  fluid.  ASSESSMENT / PLAN:  PULMONARY A:  Acute respiratory failure - due to pulmonary edema LL Opacity - favor  infiltrate, w/ fever and rising wbc  6/29>increased WOB w/ more fio2 demands requiring BIPAP support  P:   - Continue on BIPAP as needed.  No need for intubation for now. - Follow CXR in am, if able upright film. - Pulm hygiene w/ xopenex as needed.  - Cont on IV abx.  CARDIOVASCULAR A:  NSTEMI Shock, presumed cardiogenic; LVEF 25%, dilated LV Coronary Artery Disease multivessel disease evident on cath, no change on repeat at [facility] 6/25 Hyperlipidemia chronic Hx HTN  6/29> increasing pressor demands w/ Levophed  P:  - Titrate Levophed for MAP >65. - Appreciate Dr vanTrigt's eval, not a candidate for CABG at this time. Consider PTCI instead when more stable. - ASA, lipitor,  heparin.  RENAL A:   Stage V CKD baseline Cr 4.6 P:   - CRRT for volume removal. - Avoid nephrotoxic medications. - Renal following.  GASTROINTESTINAL A:  No active issues P:   - Start diet. - Protonix for SUP.  HEMATOLOGIC A:   Leukocytosis d/t stres/MI / infxn  Coagulopathy due to heparin infusion   Iron deficiency anemia chronic >>S/p 1 u PRBC 6/28  P:  - Continue ferrous sulfate - Follow CBC - Heparin, no need for SCD's while on gtt  INFECTIOUS A:   Leukocytosis Maia Plan /LLL infiltrate suspect new left sided PNA  P:   - Follow WBC - BC NTD - Cont vanc /zosyn  - Check PCT in am   ENDOCRINE A:   Type 2 DM with hyperglycemia   P:   - CBG q4h - SSI  NEUROLOGIC A:   Acute encephalopathy due to  critical illness >improved  P:   - Monitor   TODAY'S SUMMARY: Mrs. Lannen presents following NSTEMI with significant CAD. EF 25% She also has baseline CKD stage V.  . On CRRT for volume removal.   Cardiology guiding either PTCI or CABG. Has multi system failure but remains alert and coherent. Updated daughter on condition. Continue on BIPAP  If worsens will require intubation.   Discussed again with family, code status confirmed, questions answered. Will continue current course.   CRITICAL CARE: The patient is critically ill with multiple organ systems failure and requires high complexity decision making for assessment and support, frequent evaluation and titration of therapies, application of advanced monitoring technologies and extensive interpretation of multiple databases. Critical Care Time devoted to patient care services described in this note is 35 minutes.   Alyson Reedy, M.D. [facility] Pulmonary/Critical Care Medicine. Pager: [phone]. After hours pager: [phone].

## 2012-07-10 NOTE — Progress Notes (Signed)
PT Cancellation Note  Patient Details Name: Joyann Spidle MRN: [id number] DOB: 1945/04/26   Cancelled Treatment:    Reason Eval/Treat Not Completed: Medical issues which prohibited therapy. RN deferred at this time due to pt placed back on con't dialysis.   Marcene Brawn 07/10/2012, 11:47 AM

## 2012-07-10 NOTE — Progress Notes (Signed)
Subjective: Interval History: none.  Objective: Vital signs in last 24 hours: Temp:  [97.4 F (36.3 C)-99.2 F (37.3 C)] 97.6 F (36.4 C) (06/30 0327) Pulse Rate:  [94-111] 101 (06/30 0700) Resp:  [15-36] 22 (06/30 0700) BP: (56-139)/(29-91) 109/67 mmHg (06/30 0700) SpO2:  [89 %-100 %] 97 % (06/30 0700) FiO2 (%):  [40 %-100 %] 40 % (06/30 0600) Weight:  [83.4 kg (183 lb 13.8 oz)] 83.4 kg (183 lb 13.8 oz) (06/30 0510) Weight change: -2.7 kg (-5 lb 15.2 oz)  Intake/Output from previous day: 06/29 0701 - 06/30 0700 In: 1594.7 [I.V.:1290.4; IV Piggyback:304.3] Out: 2362 [Urine:95] Intake/Output this shift:    General appearance: cooperative, pale and slowed mentation Resp: rales bibasilar Cardio: S1, S2 normal and systolic murmur: holosystolic 2/6, blowing at apex GI: pos bs. soft. liver down 4 cm Extremities: edema 1+  Lab Results:  Recent Labs  07/09/12 0319 07/10/12 0420  WBC 22.4* 30.2*  HGB 8.8* 8.8*  HCT 26.8* 25.8*  PLT 121* 167   BMET:  Recent Labs  07/09/12 1600 07/10/12 0420  NA 132* 133*  K 3.9 3.4*  CL 96 95*  CO2 21 21  GLUCOSE 149* 151*  BUN 52* 39*  CREATININE 4.24* 3.05*  CALCIUM 9.1 9.2   No results found for this basename: PTH,  in the last 72 hours Iron Studies: No results found for this basename: IRON, TIBC, TRANSFERRIN, FERRITIN,  in the last 72 hours  Studies/Results: Dg Chest Port 1 View  07/10/2012   *RADIOLOGY REPORT*  Clinical Data: Shortness of breath and respiratory failure.  PORTABLE CHEST - 1 VIEW  Comparison: 07/09/2012  Findings: Dialysis catheter tip is at the junction of the superior vena cava and right atrium.  Left jugular central line in the upper SVC region.  Persistent densities at the left lung base could represent airspace disease and pleural fluid.  Heart size is stable.  No evidence for a large pneumothorax.  IMPRESSION: Persistent densities in the left lower chest.  Findings may represent a combination airspace  disease and pleural fluid.  Support apparatuses as described.   Original Report Authenticated By: Richarda Overlie, M.D.   Dg Chest Port 1 View  07/09/2012   *RADIOLOGY REPORT*  Clinical Data: Shortness of breath  PORTABLE CHEST - 1 VIEW  Comparison: 1 day prior  Findings: Right IJ dialysis catheter unchanged.  Left internal jugular line terminates at mid SVC.  Apical lordotic patient positioning. Cardiomegaly accentuated by AP portable technique.  Development of a small left pleural effusion. No pneumothorax.  Left base air space disease.  IMPRESSION: Development of left base air space disease and adjacent pleural fluid.  This airspace disease could represent atelectasis or infection/aspiration.  Asymmetric alveolar pulmonary edema felt less likely.   Original Report Authenticated By: Jeronimo Greaves, M.D.    I have reviewed the patient's current medications.  Assessment/Plan: 1 ESRD/CKD 5 vol improving willl cont 100 cc/h off. Acidemic, ^ Clearance.  K low replete and change solutions.  Mechanics ok. 2 Anemia will use EPO and Fe as needed. 3 CAD with low EF primary issue.   4 Schock  On pressors and milranone .  Need to see if can lessen. 5 DM controlled 6 ^ Lipids P Change soln, K, epo ,Fe. Wean pressors as tol    LOS: 5 days   Aarika Moon L 07/10/2012,7:47 AM

## 2012-07-10 NOTE — Progress Notes (Signed)
ANTICOAGULATION CONSULT NOTE - Follow Up Consult  Pharmacy Consult for : Heparin Indication:  ACS  Dosing Weight: 77 kg  Labs:  Recent Labs  07/08/12 0625  07/09/12 0319 07/09/12 0320 07/09/12 1600 07/10/12 0420 07/10/12 1200  HGB 8.1*  --  8.8*  --   --  8.8*  --   HCT 25.1*  --  26.8*  --   --  25.8*  --   PLT 146*  --  121*  --   --  167  --   HEPARINUNFRC  --   --  0.39  --   --  0.24* 0.29*  CREATININE  --   < >  --  4.88* 4.24* 3.05*  --   < > = values in this interval not displayed. Lab Results  Component Value Date   INR 1.16 06/24/2012   Medications:  Prescriptions prior to admission  Medication Sig Dispense Refill  . amLODipine (NORVASC) 10 MG tablet Take 10 mg by mouth daily.      Marland Kitchen aspirin 325 MG tablet Take 325 mg by mouth daily.      Marland Kitchen atorvastatin (LIPITOR) 40 MG tablet Take 40 mg by mouth daily.      Marland Kitchen b complex-vitamin c-folic acid (NEPHRO-VITE) 0.8 MG TABS Take 0.8 mg by mouth at bedtime.      . carvedilol (COREG) 25 MG tablet Take 25 mg by mouth 2 (two) times daily with a meal.      . cloNIDine (CATAPRES) 0.1 MG tablet Take 0.1 mg by mouth 3 (three) times daily.      . clopidogrel (PLAVIX) 75 MG tablet Take 75 mg by mouth daily.      . ferrous sulfate 325 (65 FE) MG tablet Take 325 mg by mouth daily with breakfast.      . furosemide (LASIX) 40 MG tablet Take 40 mg by mouth.      Marland Kitchen glimepiride (AMARYL) 4 MG tablet Take 4 mg by mouth daily before breakfast.      . insulin glargine (LANTUS) 100 UNIT/ML injection Inject 12 Units into the skin at bedtime.      . paricalcitol (ZEMPLAR) 1 MCG capsule Take 1 mcg by mouth daily.      . sodium bicarbonate 650 MG tablet Take 650 mg by mouth 4 (four) times daily.       Assessment: 67 y/o AA female transferred to [facility] for treatment/evaluation of Acute MI.  She continues on IV heparin drip 1200 uts/hr with heparin level this morning of 0.29 which is slightly <  the desired goal range. However che has chronic  anemia  She received 1 unit PRBC yesterday for her anemia and her H/H is slightly up from 8.1>>8.8 and 25.1>>26.8.  Her platelets are stable at 167  but no noted bleeding complications.    Goal of therapy: Heparin level 0.3-0.7 units/ml Monitor platelets by anticoagulation protocol: Yes   Plan:  1.  Will continue current heparin at rate of  1200 units/hr. 2.  F/U AM heparin level 3.  Monitor for s/s of bleeding  Leota Sauers Pharm.D. CPP, BCPS Clinical Pharmacist [phone] 07/10/2012 1:32 PM

## 2012-07-10 NOTE — Progress Notes (Signed)
Procedure(s) (LRB): CORONARY ARTERY BYPASS GRAFTING (CABG) (N/A) INTRAOPERATIVE TRANSESOPHAGEAL ECHOCARDIOGRAM (N/A) Subjective: Status post completed subendocardial MI poor LV function and clinical cardiogenic shock  Patient extubated but is now on increasing doses of pressor worse for hypotension  Patient remains on continuous dialysis, BiPAP  Patient not a candidate for CABG--PCI of LAD and circumflex would be recommended for non-surgical revascularization if felt indicated  Objective: Vital signs in last 24 hours: Temp:  [97.4 F (36.3 C)-98 F (36.7 C)] 97.6 F (36.4 C) (06/30 1200) Pulse Rate:  [94-105] 100 (06/30 1400) Cardiac Rhythm:  [-] Normal sinus rhythm (06/30 0800) Resp:  [15-36] 22 (06/30 1500) BP: (70-139)/(31-91) 104/57 mmHg (06/30 1500) SpO2:  [89 %-100 %] 99 % (06/30 1400) FiO2 (%):  [40 %-100 %] 40 % (06/30 0825) Weight:  [183 lb 13.8 oz (83.4 kg)] 183 lb 13.8 oz (83.4 kg) (06/30 0510)  Hemodynamic parameters for last 24 hours: CVP:  [6 mmHg-18 mmHg] 12 mmHg  Intake/Output from previous day: 06/29 0701 - 06/30 0700 In: 1671.8 [I.V.:1317.5; IV Piggyback:354.3] Out: 2362 [Urine:95] Intake/Output this shift: Total I/O In: 932.9 [P.O.:120; I.V.:362.9; IV Piggyback:450] Out: 1409 [Urine:21; Other:1388] Exam Follows commands Extremely weak and fragile-appearing   Lab Results:  Recent Labs  07/09/12 0319 07/10/12 0420  WBC 22.4* 30.2*  HGB 8.8* 8.8*  HCT 26.8* 25.8*  PLT 121* 167   BMET:  Recent Labs  07/09/12 1600 07/10/12 0420  NA 132* 133*  K 3.9 3.4*  CL 96 95*  CO2 21 21  GLUCOSE 149* 151*  BUN 52* 39*  CREATININE 4.24* 3.05*  CALCIUM 9.1 9.2    PT/INR: No results found for this basename: LABPROT, INR,  in the last 72 hours ABG    Component Value Date/Time   PHART 7.387 07/09/2012 0607   HCO3 16.5* 07/09/2012 0607   TCO2 17 07/09/2012 0607   ACIDBASEDEF 7.0* 07/09/2012 0607   O2SAT 95.0 07/09/2012 0607   CBG (last 3)    Recent Labs  07/10/12 0359 07/10/12 0824 07/10/12 1206  GLUCAP 150* 135* 146*    Assessment/Plan: Acute MI Being managed medically for multisystem failure Not candidate for CABG because of increasing pressors and multisystem failure   situation discussed with patient  LOS: 5 days    VAN TRIGT III,PETER 07/10/2012

## 2012-07-10 NOTE — Progress Notes (Signed)
    Subjective:  Feels weak. No chest pain. Mild dyspnea. On CVVHD now.   Objective:  Vital Signs in the last 24 hours: Temp:  [97.4 F (36.3 C)-98 F (36.7 C)] 97.6 F (36.4 C) (06/30 1200) Pulse Rate:  [94-105] 100 (06/30 1400) Resp:  [15-36] 23 (06/30 1400) BP: (70-139)/(31-91) 94/51 mmHg (06/30 1400) SpO2:  [89 %-100 %] 99 % (06/30 1400) FiO2 (%):  [40 %-100 %] 40 % (06/30 0825) Weight:  [83.4 kg (183 lb 13.8 oz)] 83.4 kg (183 lb 13.8 oz) (06/30 0510)  Intake/Output from previous day: 06/29 0701 - 06/30 0700 In: 1671.8 [I.V.:1317.5; IV Piggyback:354.3] Out: 2362 [Urine:95]  Physical Exam: Pt is alert and oriented, ill-appearing woman in NAD HEENT: normal Neck: JVP - normal Lungs: CTA bilaterally CV: RRR without murmur or gallop Abd: soft, NT, Positive BS, obese Ext: no C/C/E, distal pulses intact and equal Skin: warm/dry no rash  Lab Results:  Recent Labs  07/09/12 0319 07/10/12 0420  WBC 22.4* 30.2*  HGB 8.8* 8.8*  PLT 121* 167    Recent Labs  07/09/12 1600 07/10/12 0420  NA 132* 133*  K 3.9 3.4*  CL 96 95*  CO2 21 21  GLUCOSE 149* 151*  BUN 52* 39*  CREATININE 4.24* 3.05*    Recent Labs  07/08/12 0625  TROPONINI 17.43*   Tele: sinus rhythm   Assessment/Plan:  1. Cardiogenic shock 2. NSTEMI 3. Multivessel CAD 4. AKI - ESRD now on CVVHD 5. Respiratory failure  Cath films reviewed. Notes of Dr Donata Clay and CCM team reviewed. Discussed situation at length with patient and family. The patient is critically-ill with multiorgan failure. Currently on vasopressors with severe LV dysfunction. No evidence of residual ischemia. I do not think she is currently stable enough for PCI. Her LAD is severely calcified and may require rotational atherectomy. This will be very high-risk considering her severe LV dysfunction and multiorgan failure. Will continue to follow clinical course and determine timing of PCI versus ongoing med Rx/palliative treatment. I  agree she is not a candidate for CABG.   Tonny Bollman, M.D. 07/10/2012, 2:34 PM

## 2012-07-11 ENCOUNTER — Inpatient Hospital Stay (HOSPITAL_COMMUNITY): Payer: Medicare Other

## 2012-07-11 LAB — RENAL FUNCTION PANEL
Albumin: 2.2 g/dL — ABNORMAL LOW (ref 3.5–5.2)
BUN: 20 mg/dL (ref 6–23)
BUN: 16 mg/dL (ref 6–23)
CO2: 25 mEq/L (ref 19–32)
Calcium: 9.2 mg/dL (ref 8.4–10.5)
Chloride: 99 mEq/L (ref 96–112)
Chloride: 98 mEq/L (ref 96–112)
Creatinine, Ser: 1.54 mg/dL — ABNORMAL HIGH (ref 0.50–1.10)
GFR calc Af Amer: 34 mL/min — ABNORMAL LOW (ref >90)
Glucose, Bld: 97 mg/dL (ref 70–99)
Phosphorus: 2 mg/dL — ABNORMAL LOW (ref 2.3–4.6)
Potassium: 3.7 mEq/L (ref 3.5–5.1)
Sodium: 135 mEq/L (ref 135–145)

## 2012-07-11 LAB — GLUCOSE, CAPILLARY
Glucose-Capillary: 107 mg/dL — ABNORMAL HIGH (ref 70–99)
Glucose-Capillary: 179 mg/dL — ABNORMAL HIGH (ref 70–99)
Glucose-Capillary: 186 mg/dL — ABNORMAL HIGH (ref 70–99)

## 2012-07-11 LAB — CBC
MCV: 69.9 fL — ABNORMAL LOW (ref 78.0–100.0)
Platelets: 194 10*3/uL (ref 150–400)
RDW: 16.4 % — ABNORMAL HIGH (ref 11.5–15.5)
WBC: 26.5 10*3/uL — ABNORMAL HIGH (ref 4.0–10.5)

## 2012-07-11 LAB — PARATHYROID HORMONE, INTACT (NO CA): PTH: 1094.2 pg/mL — ABNORMAL HIGH (ref 14.0–72.0)

## 2012-07-11 LAB — HEPARIN LEVEL (UNFRACTIONATED): Heparin Unfractionated: 0.29 IU/mL — ABNORMAL LOW (ref 0.30–0.70)

## 2012-07-11 MED ORDER — HEPARIN (PORCINE) IN NACL 100-0.45 UNIT/ML-% IJ SOLN
1250.0000 [IU]/h | INTRAMUSCULAR | Status: DC
  Administered 2012-07-11 – 2012-07-13 (×3): 1250 [IU]/h via INTRAVENOUS
  Filled 2012-07-11 (×6): qty 250

## 2012-07-11 NOTE — Progress Notes (Addendum)
ANTICOAGULATION CONSULT NOTE - Follow Up Consult  Pharmacy Consult for : Heparin Indication:  ACS  Pharmacy consult: vanc/zosyn Indication: leukocytosis/pna  Dosing Weight: 77 kg  Labs:  Recent Labs  07/09/12 0319  07/10/12 0420 07/10/12 1200 07/10/12 1600 07/11/12 0350 07/11/12 0400  HGB 8.8*  --  8.8*  --   --  9.7*  --   HCT 26.8*  --  25.8*  --   --  28.8*  --   PLT 121*  --  167  --   --  194  --   HEPARINUNFRC 0.39  --  0.24* 0.29*  --  0.29*  --   CREATININE  --   < > 3.05*  --  2.06*  --  1.75*  < > = values in this interval not displayed. Lab Results  Component Value Date   INR 1.16 07/18/12   Medications:  Prescriptions prior to admission  Medication Sig Dispense Refill  . amLODipine (NORVASC) 10 MG tablet Take 10 mg by mouth daily.      Marland Kitchen aspirin 325 MG tablet Take 325 mg by mouth daily.      Marland Kitchen atorvastatin (LIPITOR) 40 MG tablet Take 40 mg by mouth daily.      Marland Kitchen b complex-vitamin c-folic acid (NEPHRO-VITE) 0.8 MG TABS Take 0.8 mg by mouth at bedtime.      . carvedilol (COREG) 25 MG tablet Take 25 mg by mouth 2 (two) times daily with a meal.      . cloNIDine (CATAPRES) 0.1 MG tablet Take 0.1 mg by mouth 3 (three) times daily.      . clopidogrel (PLAVIX) 75 MG tablet Take 75 mg by mouth daily.      . ferrous sulfate 325 (65 FE) MG tablet Take 325 mg by mouth daily with breakfast.      . furosemide (LASIX) 40 MG tablet Take 40 mg by mouth.      Marland Kitchen glimepiride (AMARYL) 4 MG tablet Take 4 mg by mouth daily before breakfast.      . insulin glargine (LANTUS) 100 UNIT/ML injection Inject 12 Units into the skin at bedtime.      . paricalcitol (ZEMPLAR) 1 MCG capsule Take 1 mcg by mouth daily.      . sodium bicarbonate 650 MG tablet Take 650 mg by mouth 4 (four) times daily.       Assessment: 67 y/o AA female transferred to [facility] for treatment/evaluation of Acute MI.  She continues on IV heparin drip 1200 uts/hr with heparin level this morning of 0.29 which is  slightly <  the desired goal range. However che has chronic anemia  She received 1 unit PRBC 6/29 for her anemia and her H/H is up 9.7. Plt improved to 194, no bleeding noted. Will make small rate increase to achieve goal.    Leukocytosis/pna: patient currently on day #3 of vancomycin and zosyn for leukocytosis and LLL pneumonia. Patient continues on renal replacement therapy with very limited uop. Tmax 99.7 noted overnight, leukocytosis continues at 26.5 but down slightly from yesterday. No growth in cultures.   6/25 urine - ng 6/28 bld x2 - ngtd   Goal of therapy: Heparin level 0.3-0.7 units/ml Monitor platelets by anticoagulation protocol: Yes Vancomycin trough 15-20   Plan:  1.  Will increase current heparin to rate of 1250 units/hr. 2.  F/U AM heparin level 3.  Monitor for s/s of bleeding 4.  Vancomycin trough later in the week - 1-2 days  Homero Fellers  Andrey Campanile PharmD., BCPS Clinical Pharmacist Pager [phone] 07/11/2012 8:48 AM

## 2012-07-11 NOTE — Progress Notes (Signed)
PULMONARY  / CRITICAL CARE MEDICINE  Name: Barbara Montoya MRN: [id number] DOB: 02-09-1945    ADMISSION DATE:  06/14/2012 CONSULTATION DATE:  06/25/2012  REFERRING MD :  Kirke Corin PRIMARY SERVICE: Cardiology  CHIEF COMPLAINT:  NSTEMI  BRIEF PATIENT DESCRIPTION: 67 year old female admitted 6/25 from [facility] following NSTEMI, c/b respiratory failure and AKI. In cardiogenic shock, new dialysis requirement.   SIGNIFICANT EVENTS / STUDIES:  6/23 ECHO >>> EF 40% 6/24 Cath >>> Mid LAD 99% stenosis, Ramus intermedius 90% stenosis, Proximal RCA 70% stenosis, Mid RCA 50% stenosis, RPDA 100% stenosis, RPLS 90% stenosis .................................................................................................................................. 6/25 Admit to [facility] 6/25 TTE >> dilated LV, EF 25%, focal hypokinesis 6/29 Increased reqt for vasopressors, on CRRT, on BiPAP for refractory hypoxemia, CXR shows new LLL consolidation and effusion  LINES / TUBES: ETT 6/25 >>>6/28 L IJ TLC ([facility]) >>> R [place] HD cath >>>  CULTURES: BC ([facility])>>> 6/25 UC>>> negative  ANTIBIOTICS: Vanc 6/29 >> Zosyn 6/28 >>  SUBJECTIVE / Interval events: Increased WOB, requiring BIPAP  Pressor demands increasing CRRT  Remains alert/following commands   VITAL SIGNS: Temp:  [97.6 F (36.4 C)-99.7 F (37.6 C)] 98.6 F (37 C) (07/01 0800) Pulse Rate:  [97-113] 102 (07/01 1100) Resp:  [15-29] 22 (07/01 1100) BP: (88-123)/(43-74) 94/47 mmHg (07/01 1100) SpO2:  [91 %-100 %] 97 % (07/01 1100) FiO2 (%):  [50 %] 50 % (07/01 0400) Weight:  [81.8 kg (180 lb 5.4 oz)] 81.8 kg (180 lb 5.4 oz) (07/01 0630)  HEMODYNAMICS: CVP:  [9 mmHg-12 mmHg] 9 mmHg  VENTILATOR SETTINGS: Vent Mode:  [-]  FiO2 (%):  [50 %] 50 %  INTAKE / OUTPUT: Intake/Output     06/30 0701 - 07/01 0700 07/01 0701 - 07/02 0700   P.O. 450 340   I.V. (mL/kg) 1139.5 (13.9) 216.1 (2.6)   IV Piggyback 800    Total Intake(mL/kg) 2389.5 (29.2) 556.1 (6.8)   Urine (mL/kg/hr) 30 (0)    Other 4114 (2.1) 855 (2.2)   Stool 1 (0)    Total Output 4145 855   Net -1755.5 -298.9          PHYSICAL EXAMINATION: General:  RASS 0, NAD on BiPAP Neuro:  PERRL, following commands, MAEs HEENT:  No scleral icterus Cardiovascular:  RRR s M Lungs:  CTA bilateral anterior Abdomen:  Round, soft, non-tender. Active BS Musculoskeletal:  MAE well and equal Skin:  Pink, warm, dry and intact  LABS:  Recent Labs Lab 06/16/2012 1425 06/30/2012 1603 06/27/2012 1952 06/20/2012 0152 06/18/2012 0500  07/07/12 0500  07/08/12 0625  07/09/12 0319  07/09/12 0607  07/10/12 0420 07/10/12 1600 07/11/12 0350 07/11/12 0400  HGB 10.8*  --   --   --  10.2*  < > 10.0*  < > 8.1*  --  8.8*  --   --   --  8.8*  --  9.7*  --   WBC 18.8*  --   --   --  15.9*  < > 19.8*  < > 17.0*  --  22.4*  --   --   --  30.2*  --  26.5*  --   PLT 186  --   --   --  163  < > 151  < > 146*  --  121*  --   --   --  167  --  194  --   NA 139  --   --   --  136  < > 135  < >  --   < >  --   < >  --   < >  133* 135  --  135  K 3.1*  --   --   --  3.7  < > 3.9  < >  --   < >  --   < >  --   < > 3.4* 3.9  --  3.7  CL 99  --   --   --  99  < > 99  < >  --   < >  --   < >  --   < > 95* 99  --  99  CO2 21  --   --   --  24  < > 25  < >  --   < >  --   < >  --   < > 21 22  --  25  GLUCOSE 91  --   --   --  201*  < > 173*  < >  --   < >  --   < >  --   < > 151* 134*  --  97  BUN 53*  --   --   --  35*  < > 19  < >  --   < >  --   < >  --   < > 39* 28*  --  20  CREATININE 4.90*  --   --   --  2.87*  < > 1.87*  < >  --   < >  --   < >  --   < > 3.05* 2.06*  --  1.75*  CALCIUM 9.2  --   --   --  7.5*  < > 8.5  < >  --   < >  --   < >  --   < > 9.2 9.4  --  9.4  MG 2.0  --   --   --   --   --   --   --   --   --   --   --   --   --  2.5  --  2.5  --   PHOS  --   --   --   --   --   < > 2.7  < >  --   < >  --   < >  --   < > 3.9 2.9  --  2.0*  AST 38*  --   --   --   --   --   --   --   --   --   --   --   --   --    --   --   --   --   ALT 13  --   --   --   --   --   --   --   --   --   --   --   --   --   --   --   --   --   ALKPHOS 63  --   --   --   --   --   --   --   --   --   --   --   --   --   --   --   --   --   BILITOT 0.2*  --   --   --   --   --   --   --   --   --   --   --   --   --   --   --   --   --  PROT 6.6  --   --   --   --   --   --   --   --   --   --   --   --   --   --   --   --   --   ALBUMIN 2.4*  --   --   --   --   < > 2.4*  < >  --   < >  --   < >  --   < > 2.3* 2.3*  --  2.2*  INR 1.16  --   --   --   --   --   --   --   --   --   --   --   --   --   --   --   --   --   TROPONINI 9.19*  --  10.83* 8.96*  --   --   --   --  17.43*  --   --   --   --   --   --   --   --   --   PROCALCITON 5.33  --   --   --  3.22  --  3.03  --   --   --   --   --   --   --  4.18  --   --   --   PROBNP 49043.0*  --   --   --   --   --   --   --   --   --   --   --   --   --   --   --   --   --   PHART  --  7.464*  --   --   --   --   --   --   --   --   --   --  7.387  --   --   --   --   --   PCO2ART  --  27.8*  --   --   --   --   --   --   --   --   --   --  27.7*  --   --   --   --   --   PO2ART  --  79.0*  --   --   --   --   --   --   --   --   --   --  78.0*  --   --   --   --   --   < > = values in this interval not displayed.  Recent Labs Lab 07/10/12 1619 07/10/12 2001 07/11/12 0028 07/11/12 0427 07/11/12 0814  GLUCAP 140* 175* 216* 107* 179*   CXR: 6/29 >  Development of left base air space disease and adjacent pleural  fluid.  ASSESSMENT / PLAN:  PULMONARY A:  Acute respiratory failure - due to pulmonary edema LL Opacity - favor  infiltrate, w/ fever and rising wbc  6/29>increased WOB w/ more fio2 demands requiring BIPAP support  P:   - Continue on BIPAP as needed.  No need for intubation for now. - Follow CXR in am, if able upright film. - Pulm hygiene w/ xopenex as needed.  - Cont on IV abx.  CARDIOVASCULAR A:  NSTEMI Shock, presumed cardiogenic; LVEF 25%,  dilated LV Coronary Artery Disease  multivessel disease evident on cath, no change on repeat at [facility] 6/25 Hyperlipidemia chronic Hx HTN  6/29> increasing pressor demands w/ Levophed  P:  - Titrate Levophed for MAP >65. - Appreciate Dr vanTrigt's eval, not a candidate for CABG at this time. Consider PTCI instead when more stable. - ASA, lipitor, heparin.  RENAL A:   Stage V CKD baseline Cr 4.6 P:   - CRRT for volume removal. - Avoid nephrotoxic medications. - Renal following.  GASTROINTESTINAL A:  No active issues P:   - Start diet. - Protonix for SUP.  HEMATOLOGIC A:   Leukocytosis d/t stres/MI / infxn  Coagulopathy due to heparin infusion   Iron deficiency anemia chronic >>S/p 1 u PRBC 6/28  P:  - Continue ferrous sulfate - Follow CBC - Heparin, no need for SCD's while on gtt  INFECTIOUS A:   Leukocytosis Maia Plan /LLL infiltrate suspect new left sided PNA  P:   - Follow WBC - BC NTD - Cont vanc /zosyn  - Check PCT in am   ENDOCRINE A:   Type 2 DM with hyperglycemia   P:   - CBG q4h - SSI  NEUROLOGIC A:   Acute encephalopathy due to critical illness >improved  P:   - Monitor   TODAY'S SUMMARY: Mrs. Mccuiston presents following NSTEMI with significant CAD. EF 25% She also has baseline CKD stage V.  . On CRRT for volume removal.   Cardiology guiding either PTCI or CABG. Has multi system failure but remains alert and coherent. Updated daughter on condition. Continue on BIPAP for comfort and if worsens will require intubation.   Discussed again with family, code status confirmed, questions answered. Will continue current course.   CRITICAL CARE: The patient is critically ill with multiple organ systems failure and requires high complexity decision making for assessment and support, frequent evaluation and titration of therapies, application of advanced monitoring technologies and extensive interpretation of multiple databases. Critical Care Time devoted to  patient care services described in this note is 35 minutes.   Alyson Reedy, M.D. [facility] Pulmonary/Critical Care Medicine. Pager: [phone]. After hours pager: [phone].

## 2012-07-11 NOTE — Progress Notes (Signed)
Subjective:  Interval History: none.  Objective: Vital signs in last 24 hours: Temp:  [97.6 F (36.4 C)-99.7 F (37.6 C)] 98.6 F (37 C) (07/01 0800) Pulse Rate:  [94-113] 107 (07/01 0800) Resp:  [15-29] 22 (07/01 0800) BP: (88-123)/(38-74) 96/66 mmHg (07/01 0800) SpO2:  [91 %-100 %] 97 % (07/01 0800) FiO2 (%):  [40 %-50 %] 50 % (07/01 0400) Weight:  [81.8 kg (180 lb 5.4 oz)] 81.8 kg (180 lb 5.4 oz) (07/01 0630) Weight change: -1.6 kg (-3 lb 8.4 oz)  Intake/Output from previous day: 06/30 0701 - 07/01 0700 In: 2389.5 [P.O.:450; I.V.:1139.5; IV Piggyback:800] Out: 4145 [Urine:30; Stool:1] Intake/Output this shift: Total I/O In: 65.3 [I.V.:65.3] Out: 195 [Other:195]  General appearance: alert, cooperative and pale Resp: diminished breath sounds bilaterally and rales bibasilar Cardio: S1, S2 normal and systolic murmur: holosystolic 2/6, blowing at apex GI: pos bs,soft.liver down 4 cm Extremities: edema 1+  Lab Results:  Recent Labs  07/10/12 0420 07/11/12 0350  WBC 30.2* 26.5*  HGB 8.8* 9.7*  HCT 25.8* 28.8*  PLT 167 194   BMET:  Recent Labs  07/10/12 1600 07/11/12 0400  NA 135 135  K 3.9 3.7  CL 99 99  CO2 22 25  GLUCOSE 134* 97  BUN 28* 20  CREATININE 2.06* 1.75*  CALCIUM 9.4 9.4   No results found for this basename: PTH,  in the last 72 hours Iron Studies: No results found for this basename: IRON, TIBC, TRANSFERRIN, FERRITIN,  in the last 72 hours  Studies/Results: Dg Chest Port 1 View  07/11/2012   *RADIOLOGY REPORT*  Clinical Data: Respiratory failure  PORTABLE CHEST - 1 VIEW  Comparison: 06/23/2012  Findings: The cardiac shadow is stable.  A left-sided jugular line and right-sided dialysis catheter are again seen and stable.  There are persistent changes in the left lung base.  This is likely a combination of infiltrate and underlying effusion.  No new focal abnormality is seen.  IMPRESSION: Stable appearance of the chest when compared with the previous  exam.   Original Report Authenticated By: Alcide Clever, M.D.   Dg Chest Port 1 View  07/10/2012   *RADIOLOGY REPORT*  Clinical Data: Shortness of breath and respiratory failure.  PORTABLE CHEST - 1 VIEW  Comparison: 07/09/2012  Findings: Dialysis catheter tip is at the junction of the superior vena cava and right atrium.  Left jugular central line in the upper SVC region.  Persistent densities at the left lung base could represent airspace disease and pleural fluid.  Heart size is stable.  No evidence for a large pneumothorax.  IMPRESSION: Persistent densities in the left lower chest.  Findings may represent a combination airspace disease and pleural fluid.  Support apparatuses as described.   Original Report Authenticated By: Richarda Overlie, M.D.    I have reviewed the patient's current medications.  Assessment/Plan: 1 AKI/CKD  Vol improving, less pressors.  CVP lower ,cut Net Neg. Acid/base/K. Solute improved with changes.  Will save arm 2 Anemia will address 3 CAD low EF per cards 4 DM stable 5 Resp failure resolved 6 CVA P lower UF, save arm, DM control, ?cath soon.    LOS: 6 days   Ajanee Buren L 07/11/2012,8:22 AM

## 2012-07-11 NOTE — Progress Notes (Signed)
Patient Name: Barbara Montoya Date of Encounter: 07/11/2012  Principal Problem:   Myocardial infarction Active Problems:   Hypertension   Diabetes mellitus   CKD (chronic kidney disease), stage V   Acute respiratory failure   Acute pulmonary edema   Altered mental status   Hypotension   Cardiomyopathy, ischemic   Cardiogenic shock    SUBJECTIVE: Denies chest pain or dyspnea.  OBJECTIVE Filed Vitals:   06/19/2012 0545 06/26/2012 0600 06/26/2012 0615 06/24/2012 0630  BP: 105/67 98/65 102/58 97/80  Pulse: 58 58 59 58  Temp:      TempSrc:      Resp: 18 18 18 18   CVP    4  Weight:      SpO2: 100% 100% 100% 100%    Intake/Output Summary (Last 24 hours) at 07/11/12 0907 Last data filed at 07/11/12 0900  Gross per 24 hour  Intake 2606.89 ml  Output   4177 ml  Net -1570.11 ml   Filed Weights   07/09/12 0500 07/10/12 0510 07/11/12 0630  Weight: 189 lb 13.1 oz (86.1 kg) 183 lb 13.8 oz (83.4 kg) 180 lb 5.4 oz (81.8 kg)    PHYSICAL EXAM General: Well developed, well nourished, female   Head: Normocephalic, atraumatic.  Neck: Supple  Lungs:  CTA anteriorly. Heart: RRR Abdomen: Soft, non-tender, non-distended Extremities: No edema   LABS: ABG    Component Value Date/Time   PHART 7.387 07/09/2012 0607   PCO2ART 27.7* 07/09/2012 0607   PO2ART 78.0* 07/09/2012 0607   HCO3 16.5* 07/09/2012 0607   TCO2 17 07/09/2012 0607   ACIDBASEDEF 7.0* 07/09/2012 0607   O2SAT 95.0 07/09/2012 0607   CBC:  Recent Labs  07/10/12 0420 07/11/12 0350  WBC 30.2* 26.5*  HGB 8.8* 9.7*  HCT 25.8* 28.8*  MCV 68.8* 69.9*  PLT 167 194   Basic Metabolic Panel:  Recent Labs  78/29/56 0420 07/10/12 1600 07/11/12 0350 07/11/12 0400  NA 133* 135  --  135  K 3.4* 3.9  --  3.7  CL 95* 99  --  99  CO2 21 22  --  25  GLUCOSE 151* 134*  --  97  BUN 39* 28*  --  20  CREATININE 3.05* 2.06*  --  1.75*  CALCIUM 9.2 9.4  --  9.4  MG 2.5  --  2.5  --   PHOS 3.9 2.9  --  2.0*   Liver Function  Tests:  Recent Labs  07/10/12 1600 07/11/12 0400  ALBUMIN 2.3* 2.2*   Cardiac Enzymes: No results found for this basename: CKTOTAL, CKMB, CKMBINDEX, TROPONINI,  in the last 72 hours BNP: Pro B Natriuretic peptide (BNP)  Date/Time Value Range Status  2012-07-20  2:25 PM 49043.0* 0 - 125 pg/mL Final   Lab Results  Component Value Date   P2Y12 275 06/30/2012   Procalcitonin 3.22 06/13/2012   CO-OX 58.2 06/15/2012    TELE:  SR   ECHO: 20-Jul-2012  Study Conclusions - Left ventricle: Septal apical inferior and inferolateral hypokinesis The cavity size was severely dilated. Wall thickness was normal. The estimated ejection fraction was 25%. - Mitral valve: Restrictive mitral inflows Mild regurgitation. - Atrial septum: No defect or patent foramen ovale was identified. - Pericardium, extracardiac: A trivial pericardial effusion was identified.  Radiology/Studies: Dg Chest Port 1 View 07/09/2012   *RADIOLOGY REPORT*  Clinical Data: Check ET tube position.Respiratory failure.  PORTABLE CHEST - 1 VIEW  Comparison: 20-Jul-2012  Findings: Support devices are unchanged.  Bibasilar atelectasis  or infiltrates, similar to prior study.  Improving aeration in the left upper lobe.  Suspect trace effusions.  Heart is normal size.  IMPRESSION: Improving aeration in the left upper lobe.  Bibasilar atelectasis, trace effusions.   Original Report Authenticated By: Charlett Nose, M.D.   Dg Chest Port 1 View 06/16/2012   *RADIOLOGY REPORT*  Clinical Data: Intubation, central line placement, hypertension, diabetes  PORTABLE CHEST - 1 VIEW  Comparison: None.  Findings: Endotracheal tube 6 cm above the carina.  Right IJ dialysis catheter tips in the SVC.  Left IJ central line tip in the mid SVC as well.  Normal heart size and vascularity.  Ill-defined increased opacity throughout the left central lung could represent developing airspace disease versus asymmetric edema.  Right lung relatively clear.  No effusion  or pneumothorax. NG tube extends below the hemidiaphragms the tip not visualized.  IMPRESSION: Support apparatus in good position.  No pneumothorax  Ill-defined increased opacity left central lung could represent developing airspace process or asymmetric edema   Original Report Authenticated By: Judie Petit. Miles Costain, M.D.     Current Medications:  . antiseptic oral rinse  15 mL Mouth Rinse BID  . aspirin  325 mg Oral Daily  . atorvastatin  80 mg Oral q1800  . clopidogrel  75 mg Oral Q breakfast  . darbepoetin (ARANESP) injection - DIALYSIS  150 mcg Intravenous Q Mon-HD  . feeding supplement  1 Container Oral TID BM  . insulin aspart  2-6 Units Subcutaneous Q4H  . multivitamin  1 tablet Oral Daily  . pantoprazole  40 mg Oral Daily  . paricalcitol  1 mcg Oral Daily  . piperacillin-tazobactam  3.375 g Intravenous Q6H  . sodium chloride  10-40 mL Intracatheter Q12H  . vancomycin  1,000 mg Intravenous Q24H   . sodium chloride 10 mL/hr at 07/11/12 0800  . sodium chloride 10 mL/hr at 07/11/12 0800  . sodium chloride Stopped (07/09/12 1642)  . heparin 1,250 Units/hr (07/11/12 0900)  . milrinone 0.25 mcg/kg/min (07/11/12 0800)  . norepinephrine (LEVOPHED) Adult infusion 14 mcg/min (07/11/12 0900)  . dialysis solution (prismaSATE) +/- additives 1,500 mL/hr at 07/11/12 0408  . dialysis solution (prismaSATE) +/- additives 800 mL/hr at 07/11/12 0651  . dialysis solution (prismaSATE) +/- additives 200 mL/hr at 07/10/12 1000    ASSESSMENT AND PLAN: Barbara Montoya is a 67 year old African American woman with a known history of hypertension, type 2 diabetes and stage V chronic kidney disease with a baseline GFR of around 10. She went to [facility] over the previously and ruled in for MI (late presentation). She was cathed at [facility]; cath with severe 3-v disease, and transferred to [facility]. CABG planned when stable. She developed acute resp failure with pulm edema and was intubated 6/25. With worsening respiratory status and EF, she  was taken back to the cath lab 6/25 but her anatomy was unchanged.   Principal Problem: 1 Myocardial infarction - continue current therapy with ASA, heparin and statin. Continue to wean levophed as tolerated. Continue milrinone. Add BB/ACE if BP allows. Once she is more stable can consider PCI. Note Dr. Donata Clay felt she is not a CABG candidate at present. 2 CKD (chronic kidney disease), stage V - per nephrology, CVVH now 3 Acute respiratory failure - improving; continue antibiotics per CCM.    Signed, Olga Millers , MD  9:07 AM 07/11/2012

## 2012-07-11 NOTE — Progress Notes (Signed)
PT Cancellation Note  Patient Details Name: Barbara Montoya MRN: [id number] DOB: 1945-03-21   Cancelled Treatment:    Reason Eval/Treat Not Completed: Medical issues which prohibited therapy. Pt remains on con't dialysis. PT order canceled. Please re-consult when pt medically appropriate. Thanks!   Onyekachi Gathright, Becky Sax 07/11/2012, 7:22 AM

## 2012-07-11 DEATH — deceased

## 2012-07-12 DIAGNOSIS — J9600 Acute respiratory failure, unspecified whether with hypoxia or hypercapnia: Secondary | ICD-10-CM

## 2012-07-12 LAB — CBC
HCT: 29.2 % — ABNORMAL LOW (ref 36.0–46.0)
Hemoglobin: 9.7 g/dL — ABNORMAL LOW (ref 12.0–15.0)
MCH: 23.6 pg — ABNORMAL LOW (ref 26.0–34.0)
MCHC: 33.2 g/dL (ref 30.0–36.0)
RDW: 17.1 % — ABNORMAL HIGH (ref 11.5–15.5)

## 2012-07-12 LAB — GLUCOSE, CAPILLARY
Glucose-Capillary: 167 mg/dL — ABNORMAL HIGH (ref 70–99)
Glucose-Capillary: 138 mg/dL — ABNORMAL HIGH (ref 70–99)
Glucose-Capillary: 163 mg/dL — ABNORMAL HIGH (ref 70–99)
Glucose-Capillary: 154 mg/dL — ABNORMAL HIGH (ref 70–99)

## 2012-07-12 LAB — RENAL FUNCTION PANEL
BUN: 13 mg/dL (ref 6–23)
CO2: 26 mEq/L (ref 19–32)
Calcium: 9.3 mg/dL (ref 8.4–10.5)
Chloride: 99 mEq/L (ref 96–112)
Creatinine, Ser: 1.56 mg/dL — ABNORMAL HIGH (ref 0.50–1.10)
GFR calc non Af Amer: 33 mL/min — ABNORMAL LOW (ref >90)
Glucose, Bld: 177 mg/dL — ABNORMAL HIGH (ref 70–99)
Glucose, Bld: 167 mg/dL — ABNORMAL HIGH (ref 70–99)
Phosphorus: 2.3 mg/dL (ref 2.3–4.6)
Potassium: 4.3 mEq/L (ref 3.5–5.1)

## 2012-07-12 MED ORDER — GLUCOSE 4 G PO CHEW
1.0000 | CHEWABLE_TABLET | Freq: Once | ORAL | Status: AC | PRN
  Administered 2012-07-12: 4 g via ORAL
  Filled 2012-07-12: qty 1

## 2012-07-12 MED ORDER — SODIUM CHLORIDE 0.9 % IV SOLN
INTRAVENOUS | Status: AC
  Administered 2012-07-12: 16:00:00 via INTRAVENOUS

## 2012-07-12 MED ORDER — GLUCOSE-VITAMIN C 4-6 GM-MG PO CHEW
CHEWABLE_TABLET | ORAL | Status: AC
  Filled 2012-07-12: qty 1

## 2012-07-12 MED ORDER — DOXERCALCIFEROL 4 MCG/2ML IV SOLN
4.0000 ug | INTRAVENOUS | Status: DC
  Administered 2012-07-12 – 2012-07-14 (×2): 4 ug via INTRAVENOUS
  Filled 2012-07-12 (×4): qty 2

## 2012-07-12 MED ORDER — SODIUM CHLORIDE 0.9 % IV SOLN
Freq: Once | INTRAVENOUS | Status: AC
  Administered 2012-07-12: 09:00:00 via INTRAVENOUS

## 2012-07-12 NOTE — Progress Notes (Signed)
Subjective: Interval History: has complaints felt bad earlier.  Objective: Vital signs in last 24 hours: Temp:  [97.8 F (36.6 C)-98.6 F (37 C)] 97.8 F (36.6 C) (07/02 0747) Pulse Rate:  [102-125] 123 (07/02 0800) Resp:  [20-29] 22 (07/02 0800) BP: (60-117)/(32-68) 102/68 mmHg (07/02 0800) SpO2:  [90 %-100 %] 95 % (07/02 0800) Weight:  [82 kg (180 lb 12.4 oz)] 82 kg (180 lb 12.4 oz) (07/02 0500) Weight change: 0.2 kg (7.1 oz)  Intake/Output from previous day: 07/01 0701 - 07/02 0700 In: 2215.5 [P.O.:580; I.V.:1235.5; IV Piggyback:400] Out: 2804 [Urine:5; Stool:100] Intake/Output this shift: Total I/O In: 271 [P.O.:220; I.V.:51] Out: 119 [Other:119]  General appearance: alert, cooperative and pale Resp: rales bibasilar Cardio: systolic murmur: holosystolic 2/6, blowing at apex GI: pos bs, liver down 4 cm Extremities: extremities normal, atraumatic, no cyanosis or edema  Lab Results:  Recent Labs  07/11/12 0350 07/12/12 0449  WBC 26.5* 28.1*  HGB 9.7* 9.7*  HCT 28.8* 29.2*  PLT 194 227   BMET:  Recent Labs  07/11/12 1600 07/12/12 0449  NA 135 136  K 4.0 4.4  CL 98 99  CO2 25 26  GLUCOSE 178* 177*  BUN 16 15  CREATININE 1.54* 1.56*  CALCIUM 9.2 9.3    Recent Labs  07/10/12 0959  PTH 1094.2*   Iron Studies: No results found for this basename: IRON, TIBC, TRANSFERRIN, FERRITIN,  in the last 72 hours  Studies/Results: Dg Chest Port 1 View  07/11/2012   *RADIOLOGY REPORT*  Clinical Data: Respiratory failure  PORTABLE CHEST - 1 VIEW  Comparison: 06/23/2012  Findings: The cardiac shadow is stable.  A left-sided jugular line and right-sided dialysis catheter are again seen and stable.  There are persistent changes in the left lung base.  This is likely a combination of infiltrate and underlying effusion.  No new focal abnormality is seen.  IMPRESSION: Stable appearance of the chest when compared with the previous exam.   Original Report Authenticated By: Alcide Clever, M.D.    I have reviewed the patient's current medications.  Assessment/Plan: 1 CRF CVP dropped, and bp down.  Will bolus and keep even and see if helps.  ? Role of heart and ^ WBC. Solute/acid/base ok. 2 Anemia stable 3 HPTH use Vit D 4 DM controlled 5 CAD per cards P BOLUS, even, Vit D    LOS: 7 days   Charlotta Lapaglia L 07/12/2012,8:44 AM

## 2012-07-12 NOTE — Progress Notes (Signed)
ANTICOAGULATION CONSULT NOTE - Follow Up Consult  Pharmacy Consult for : Heparin Indication:  ACS  Dosing Weight: 77 kg  Labs:  Recent Labs  07/10/12 0420 07/10/12 1200  07/11/12 0350 07/11/12 0400 07/11/12 1600 07/12/12 0449  HGB 8.8*  --   --  9.7*  --   --  9.7*  HCT 25.8*  --   --  28.8*  --   --  29.2*  PLT 167  --   --  194  --   --  227  HEPARINUNFRC 0.24* 0.29*  --  0.29*  --   --  0.52  CREATININE 3.05*  --   < >  --  1.75* 1.54* 1.56*  < > = values in this interval not displayed. Lab Results  Component Value Date   INR 1.16 07/04/2012    Assessment: 67 y/o AA female transferred to [facility] for treatment/evaluation of Acute MI.  She continues on IV heparin drip 1250 uts/hr with heparin level this morning of 0.52 which is at goal. CBC stable  Goal of therapy: Heparin level 0.3-0.7 units/ml Monitor platelets by anticoagulation protocol: Yes   Plan:  1.  Continue heparin at 1250 units / hr 2.  F/U AM heparin level 3.  Monitor for s/s of bleeding  Thank you. Okey Regal, PharmD [phone]  07/12/2012 8:48 AM

## 2012-07-12 NOTE — Progress Notes (Signed)
Patient Name: Barbara Montoya Date of Encounter: 07/12/2012  Principal Problem:   Myocardial infarction Active Problems:   Hypertension   Diabetes mellitus   CKD (chronic kidney disease), stage V   Acute respiratory failure   Acute pulmonary edema   Altered mental status   Hypotension   Cardiomyopathy, ischemic   Cardiogenic shock    SUBJECTIVE: Events noted. Rough weekend but avoided reintubation. Denies chest pain or dyspnea. She improved yesterday. However, overnight gradual drop in BP requiring higher dose of Levophed.   OBJECTIVE Filed Vitals:   06/14/2012 0545 06/23/2012 0600 06/30/2012 0615 06/13/2012 0630  BP: 105/67 98/65 102/58 97/80  Pulse: 58 58 59 58  Temp:      TempSrc:      Resp: 18 18 18 18   CVP    4  Weight:      SpO2: 100% 100% 100% 100%    Intake/Output Summary (Last 24 hours) at 07/12/12 0734 Last data filed at 07/12/12 0700  Gross per 24 hour  Intake 2215.47 ml  Output   2804 ml  Net -588.53 ml   Filed Weights   07/10/12 0510 07/11/12 0630 07/12/12 0500  Weight: 83.4 kg (183 lb 13.8 oz) 81.8 kg (180 lb 5.4 oz) 82 kg (180 lb 12.4 oz)    PHYSICAL EXAM General: Well developed, well nourished, female   Head: Normocephalic, atraumatic.  Neck: Supple  Lungs:  CTA anteriorly. Heart: RRR Abdomen: Soft, non-tender, non-distended Extremities: No edema   LABS: ABG    Component Value Date/Time   PHART 7.387 07/09/2012 0607   PCO2ART 27.7* 07/09/2012 0607   PO2ART 78.0* 07/09/2012 0607   HCO3 16.5* 07/09/2012 0607   TCO2 17 07/09/2012 0607   ACIDBASEDEF 7.0* 07/09/2012 0607   O2SAT 95.0 07/09/2012 0607   CBC:  Recent Labs  07/11/12 0350 07/12/12 0449  WBC 26.5* 28.1*  HGB 9.7* 9.7*  HCT 28.8* 29.2*  MCV 69.9* 71.0*  PLT 194 227   Basic Metabolic Panel:  Recent Labs  16/10/96 0350  07/11/12 1600 07/12/12 0449  NA  --   < > 135 136  K  --   < > 4.0 4.4  CL  --   < > 98 99  CO2  --   < > 25 26  GLUCOSE  --   < > 178* 177*  BUN  --   <  > 16 15  CREATININE  --   < > 1.54* 1.56*  CALCIUM  --   < > 9.2 9.3  MG 2.5  --   --  2.6*  PHOS  --   < > 2.2* 2.3  < > = values in this interval not displayed. Liver Function Tests:  Recent Labs  07/11/12 1600 07/12/12 0449  ALBUMIN 2.2* 2.1*   Cardiac Enzymes: No results found for this basename: CKTOTAL, CKMB, CKMBINDEX, TROPONINI,  in the last 72 hours BNP: Pro B Natriuretic peptide (BNP)  Date/Time Value Range Status  06/14/2012  2:25 PM 49043.0* 0 - 125 pg/mL Final   Lab Results  Component Value Date   P2Y12 275 06/29/2012   Procalcitonin 3.22 06/25/2012   CO-OX 58.2 07/10/2012    TELE:  SR   ECHO: 07/05/2012  Study Conclusions - Left ventricle: Septal apical inferior and inferolateral hypokinesis The cavity size was severely dilated. Wall thickness was normal. The estimated ejection fraction was 25%. - Mitral valve: Restrictive mitral inflows Mild regurgitation. - Atrial septum: No defect or patent foramen ovale was identified. -  Pericardium, extracardiac: A trivial pericardial effusion was identified.  Radiology/Studies: Dg Chest Port 1 View 06/17/2012   *RADIOLOGY REPORT*  Clinical Data: Check ET tube position.Respiratory failure.  PORTABLE CHEST - 1 VIEW  Comparison: 06/14/2012  Findings: Support devices are unchanged.  Bibasilar atelectasis or infiltrates, similar to prior study.  Improving aeration in the left upper lobe.  Suspect trace effusions.  Heart is normal size.  IMPRESSION: Improving aeration in the left upper lobe.  Bibasilar atelectasis, trace effusions.   Original Report Authenticated By: Charlett Nose, M.D.   Dg Chest Port 1 View 07/09/2012   *RADIOLOGY REPORT*  Clinical Data: Intubation, central line placement, hypertension, diabetes  PORTABLE CHEST - 1 VIEW  Comparison: None.  Findings: Endotracheal tube 6 cm above the carina.  Right IJ dialysis catheter tips in the SVC.  Left IJ central line tip in the mid SVC as well.  Normal heart size and  vascularity.  Ill-defined increased opacity throughout the left central lung could represent developing airspace disease versus asymmetric edema.  Right lung relatively clear.  No effusion or pneumothorax. NG tube extends below the hemidiaphragms the tip not visualized.  IMPRESSION: Support apparatus in good position.  No pneumothorax  Ill-defined increased opacity left central lung could represent developing airspace process or asymmetric edema   Original Report Authenticated By: Judie Petit. Miles Costain, M.D.     Current Medications:  . antiseptic oral rinse  15 mL Mouth Rinse BID  . aspirin  325 mg Oral Daily  . atorvastatin  80 mg Oral q1800  . clopidogrel  75 mg Oral Q breakfast  . darbepoetin (ARANESP) injection - DIALYSIS  150 mcg Intravenous Q Mon-HD  . feeding supplement  1 Container Oral TID BM  . insulin aspart  2-6 Units Subcutaneous Q4H  . multivitamin  1 tablet Oral Daily  . pantoprazole  40 mg Oral Daily  . paricalcitol  1 mcg Oral Daily  . piperacillin-tazobactam  3.375 g Intravenous Q6H  . sodium chloride  10-40 mL Intracatheter Q12H  . vancomycin  1,000 mg Intravenous Q24H   . sodium chloride 10 mL/hr at 07/11/12 0800  . sodium chloride 10 mL/hr at 07/12/12 0538  . sodium chloride 10 mL/hr at 07/12/12 0538  . heparin 1,250 Units/hr (07/12/12 0537)  . milrinone 0.25 mcg/kg/min (07/11/12 2047)  . norepinephrine (LEVOPHED) Adult infusion 25 mcg/min (07/12/12 0537)  . dialysis solution (prismaSATE) +/- additives 1,500 mL/hr at 07/12/12 0458  . dialysis solution (prismaSATE) +/- additives 800 mL/hr at 07/12/12 0447  . dialysis solution (prismaSATE) +/- additives 200 mL/hr at 07/12/12 0447    ASSESSMENT AND PLAN: Barbara Montoya is a 67 year old African American woman with a known history of hypertension, type 2 diabetes and stage V chronic kidney disease with a baseline GFR of around 10. She went to [facility] over the previously and ruled in for MI (late presentation). She was cathed at [facility]; cath  with severe 3-v disease, and transferred to [facility]. CABG planned when stable. She developed acute resp failure with pulm edema and was intubated 6/25. With worsening respiratory status and EF, she was taken back to the cath lab 6/25 but her anatomy was unchanged.   Principal Problem: 1. Shock: likely cardiogenic with possible septic component due to pneumonia and associated hypovolemia. CVP today only 2. Fluid management with dialysis is difficult. We might need to consider placing a Swan-Ganz catheter and get a heart failure consult.    2.  Myocardial infarction - continue current therapy with ASA, Plavix,  heparin and statin. Once she is more stable can consider PCI. Note Dr. Donata Clay felt she is not a CABG candidate at present.  2 CKD (chronic kidney disease), stage V - per nephrology, CVVH now  3 Acute respiratory failure - improving; continue antibiotics per CCM.    Signed, Lorine Bears , MD  7:34 AM 07/12/2012

## 2012-07-12 NOTE — Progress Notes (Signed)
PULMONARY  / CRITICAL CARE MEDICINE  Name: Barbara Montoya MRN: [id number] DOB: 30-Jun-1945    ADMISSION DATE:  06/12/2012 CONSULTATION DATE:  06/18/2012  REFERRING MD :  Kirke Corin PRIMARY SERVICE: Cardiology  CHIEF COMPLAINT:  NSTEMI  BRIEF PATIENT DESCRIPTION: 67 year old female admitted 6/25 from [place] following NSTEMI, c/b respiratory failure and AKI. In cardiogenic shock, new dialysis requirement.   SIGNIFICANT EVENTS / STUDIES:  6/23 ECHO >>> EF 40% 6/24 Cath >>> Mid LAD 99% stenosis, Ramus intermedius 90% stenosis, Proximal RCA 70% stenosis, Mid RCA 50% stenosis, RPDA 100% stenosis, RPLS 90% stenosis .................................................................................................................................. 6/25 Admit to [facility] 6/25 TTE >> dilated LV, EF 25%, focal hypokinesis 6/29 Increased reqt for vasopressors, on CRRT, on BiPAP for refractory hypoxemia, CXR shows new LLL consolidation and effusion  LINES / TUBES: ETT 6/25 >>>6/28 L IJ TLC ([facility]) >>> R [place] HD cath >>>  CULTURES: BC ([facility])>>> 6/25 UC>>> negative  ANTIBIOTICS: Vanc 6/29 >> Zosyn 6/28 >>  SUBJECTIVE / Interval events: Increased WOB, requiring BIPAP  Pressor demands increasing CRRT  Remains alert/following commands   VITAL SIGNS: Temp:  [97.8 F (36.6 C)-98.6 F (37 C)] 97.8 F (36.6 C) (07/02 0747) Pulse Rate:  [108-125] 108 (07/02 1200) Resp:  [17-29] 17 (07/02 1200) BP: (60-117)/(32-68) 99/61 mmHg (07/02 1200) SpO2:  [90 %-100 %] 98 % (07/02 1200) Weight:  [82 kg (180 lb 12.4 oz)] 82 kg (180 lb 12.4 oz) (07/02 0500)  HEMODYNAMICS: CVP:  [2 mmHg-10 mmHg] 2 mmHg  VENTILATOR SETTINGS:    INTAKE / OUTPUT: Intake/Output     07/01 0701 - 07/02 0700 07/02 0701 - 07/03 0700   P.O. 580 220   I.V. (mL/kg) 1235.5 (15.1) 551.4 (6.7)   IV Piggyback 400 50   Total Intake(mL/kg) 2215.5 (27) 821.4 (10)   Urine (mL/kg/hr) 5 (0)    Other 2699 (1.4) 172 (0.4)   Stool 100 (0.1)     Total Output 2804 172   Net -588.5 +649.4         PHYSICAL EXAMINATION: General:  RASS +1, off BiPAP Neuro:  PERRL, following commands, MAEs HEENT:  No scleral icterus Cardiovascular:  RRR s M Lungs:  CTA bilateral anterior Abdomen:  Round, soft, non-tender. Active BS Musculoskeletal:  MAE well and equal Skin:  Pink, warm, dry and intact  LABS:  Recent Labs Lab 06/25/2012 1425 07/09/2012 1603 06/18/2012 1952 06/19/2012 0152 06/15/2012 0500  07/07/12 0500  07/08/12 0625  07/09/12 0607  07/10/12 0420  07/11/12 0350 07/11/12 0400 07/11/12 1600 07/12/12 0449  HGB 10.8*  --   --   --  10.2*  < > 10.0*  < > 8.1*  < >  --   --  8.8*  --  9.7*  --   --  9.7*  WBC 18.8*  --   --   --  15.9*  < > 19.8*  < > 17.0*  < >  --   --  30.2*  --  26.5*  --   --  28.1*  PLT 186  --   --   --  163  < > 151  < > 146*  < >  --   --  167  --  194  --   --  227  NA 139  --   --   --  136  < > 135  < >  --   < >  --   < > 133*  < >  --  135 135  136  K 3.1*  --   --   --  3.7  < > 3.9  < >  --   < >  --   < > 3.4*  < >  --  3.7 4.0 4.4  CL 99  --   --   --  99  < > 99  < >  --   < >  --   < > 95*  < >  --  99 98 99  CO2 21  --   --   --  24  < > 25  < >  --   < >  --   < > 21  < >  --  25 25 26   GLUCOSE 91  --   --   --  201*  < > 173*  < >  --   < >  --   < > 151*  < >  --  97 178* 177*  BUN 53*  --   --   --  35*  < > 19  < >  --   < >  --   < > 39*  < >  --  20 16 15   CREATININE 4.90*  --   --   --  2.87*  < > 1.87*  < >  --   < >  --   < > 3.05*  < >  --  1.75* 1.54* 1.56*  CALCIUM 9.2  --   --   --  7.5*  < > 8.5  < >  --   < >  --   < > 9.2  < >  --  9.4 9.2 9.3  MG 2.0  --   --   --   --   --   --   --   --   --   --   --  2.5  --  2.5  --   --  2.6*  PHOS  --   --   --   --   --   < > 2.7  < >  --   < >  --   < > 3.9  < >  --  2.0* 2.2* 2.3  AST 38*  --   --   --   --   --   --   --   --   --   --   --   --   --   --   --   --   --   ALT 13  --   --   --   --   --   --   --   --   --   --   --   --    --   --   --   --   --   ALKPHOS 63  --   --   --   --   --   --   --   --   --   --   --   --   --   --   --   --   --   BILITOT 0.2*  --   --   --   --   --   --   --   --   --   --   --   --   --   --   --   --   --  PROT 6.6  --   --   --   --   --   --   --   --   --   --   --   --   --   --   --   --   --   ALBUMIN 2.4*  --   --   --   --   < > 2.4*  < >  --   < >  --   < > 2.3*  < >  --  2.2* 2.2* 2.1*  INR 1.16  --   --   --   --   --   --   --   --   --   --   --   --   --   --   --   --   --   TROPONINI 9.19*  --  10.83* 8.96*  --   --   --   --  17.43*  --   --   --   --   --   --   --   --   --   PROCALCITON 5.33  --   --   --  3.22  --  3.03  --   --   --   --   --  4.18  --   --   --   --   --   PROBNP 49043.0*  --   --   --   --   --   --   --   --   --   --   --   --   --   --   --   --   --   PHART  --  7.464*  --   --   --   --   --   --   --   --  7.387  --   --   --   --   --   --   --   PCO2ART  --  27.8*  --   --   --   --   --   --   --   --  27.7*  --   --   --   --   --   --   --   PO2ART  --  79.0*  --   --   --   --   --   --   --   --  78.0*  --   --   --   --   --   --   --   < > = values in this interval not displayed.  Recent Labs Lab 07/11/12 0814 07/11/12 1158 07/11/12 1621 07/11/12 2329 07/12/12 0745  GLUCAP 179* 193* 186* 188* 167*   CXR: 6/29 >  Development of left base air space disease and adjacent pleural  fluid.  ASSESSMENT / PLAN:  PULMONARY A:  Acute respiratory failure - due to pulmonary edema LL Opacity - favor  infiltrate, w/ fever and rising wbc  6/29>increased WOB w/ more fio2 demands requiring BIPAP support  P:   - Change BiPAP to PRN. - Pulm hygiene w/ xopenex as needed.  - Cont on IV abx.  CARDIOVASCULAR A:  NSTEMI Shock, presumed cardiogenic; LVEF 25%, dilated LV Coronary Artery Disease multivessel disease evident on cath, no change on repeat at [facility] 6/25 Hyperlipidemia chronic Hx HTN  7/2>>Increase pressors demand  and  increase HR after CRRT negative.  Likely volume depleted. P:  - Titrate Levophed for MAP >65. - Appreciate Dr vanTrigt's eval, not a candidate for CABG at this time. Consider PTCI instead when more stable. - ASA, lipitor, heparin. - Would give mild volume back to address HR and BP.  RENAL A:   Stage V CKD baseline Cr 4.6 P:   - CRRT volume even at this point. - Avoid nephrotoxic medications. - Renal following.  GASTROINTESTINAL A:  No active issues P:   - Continue diet. - Protonix for SUP.  HEMATOLOGIC A:   Leukocytosis d/t stres/MI / infxn  Coagulopathy due to heparin infusion   Iron deficiency anemia chronic >>S/p 1 u PRBC 6/28  P:  - Continue ferrous sulfate. - Follow CBC. - Heparin, no need for SCD's while on gtt  INFECTIOUS A:   Leukocytosis Maia Plan /LLL infiltrate suspect new left sided PNA  P:   - Follow WBC - BC NTD - Cont vanc /zosyn   ENDOCRINE A:   Type 2 DM with hyperglycemia   P:   - CBG q4h - SSI  NEUROLOGIC A:   Acute encephalopathy due to critical illness >improved  P:   - Monitor.  TODAY'S SUMMARY: Mrs. Lora presents following NSTEMI with significant CAD. EF 25% She also has baseline CKD stage V.  . On CRRT for volume even.  Cardiology guiding either PTCI or CABG. Has multi system failure but remains alert and coherent.  Discussed again with family, code status confirmed, questions answered. Will continue current course.   CRITICAL CARE: The patient is critically ill with multiple organ systems failure and requires high complexity decision making for assessment and support, frequent evaluation and titration of therapies, application of advanced monitoring technologies and extensive interpretation of multiple databases. Critical Care Time devoted to patient care services described in this note is 35 minutes.   Alyson Reedy, M.D. [facility] Pulmonary/Critical Care Medicine. Pager: [phone]. After hours pager: [phone].

## 2012-07-12 NOTE — Progress Notes (Signed)
   Patient's case discussed with Dr. Kirke Corin. Chart reviewed. Will likely need Swan placement to guide inotrope wean and hemodynamic optimization prior to possible high-risk PCI tomorrow.   Advanced HF team will see on 7/3.  Virgal Warmuth,MD 9:09 PM

## 2012-07-13 ENCOUNTER — Inpatient Hospital Stay (HOSPITAL_COMMUNITY): Payer: Medicare Other

## 2012-07-13 LAB — HEPARIN LEVEL (UNFRACTIONATED)
Heparin Unfractionated: 1.06 IU/mL — ABNORMAL HIGH (ref 0.30–0.70)
Heparin Unfractionated: 1.38 IU/mL — ABNORMAL HIGH (ref 0.30–0.70)

## 2012-07-13 LAB — CARBOXYHEMOGLOBIN: Carboxyhemoglobin: 1.3 % (ref 0.5–1.5)

## 2012-07-13 LAB — RENAL FUNCTION PANEL
Albumin: 1.9 g/dL — ABNORMAL LOW (ref 3.5–5.2)
BUN: 13 mg/dL (ref 6–23)
Chloride: 101 mEq/L (ref 96–112)
Creatinine, Ser: 1.39 mg/dL — ABNORMAL HIGH (ref 0.50–1.10)
GFR calc non Af Amer: 38 mL/min — ABNORMAL LOW (ref >90)
Phosphorus: 3.9 mg/dL (ref 2.3–4.6)

## 2012-07-13 LAB — COMPREHENSIVE METABOLIC PANEL
Albumin: 2 g/dL — ABNORMAL LOW (ref 3.5–5.2)
Alkaline Phosphatase: 88 U/L (ref 39–117)
BUN: 11 mg/dL (ref 6–23)
Chloride: 100 mEq/L (ref 96–112)
Creatinine, Ser: 1.34 mg/dL — ABNORMAL HIGH (ref 0.50–1.10)
GFR calc Af Amer: 46 mL/min — ABNORMAL LOW (ref >90)
Glucose, Bld: 177 mg/dL — ABNORMAL HIGH (ref 70–99)
Potassium: 4.2 mEq/L (ref 3.5–5.1)
Total Bilirubin: 0.4 mg/dL (ref 0.3–1.2)
Total Protein: 5.7 g/dL — ABNORMAL LOW (ref 6.0–8.3)

## 2012-07-13 LAB — CBC
HCT: 27.6 % — ABNORMAL LOW (ref 36.0–46.0)
Hemoglobin: 8.8 g/dL — ABNORMAL LOW (ref 12.0–15.0)
MCH: 23.2 pg — ABNORMAL LOW (ref 26.0–34.0)
MCHC: 32 g/dL (ref 30.0–36.0)
MCV: 72.8 fL — ABNORMAL LOW (ref 78.0–100.0)
RBC: 3.79 MIL/uL — ABNORMAL LOW (ref 3.87–5.11)
RDW: 17.4 % — ABNORMAL HIGH (ref 11.5–15.5)
WBC: 28.6 10*3/uL — ABNORMAL HIGH (ref 4.0–10.5)

## 2012-07-13 LAB — GLUCOSE, CAPILLARY
Glucose-Capillary: 67 mg/dL — ABNORMAL LOW (ref 70–99)
Glucose-Capillary: 69 mg/dL — ABNORMAL LOW (ref 70–99)
Glucose-Capillary: 87 mg/dL (ref 70–99)
Glucose-Capillary: 184 mg/dL — ABNORMAL HIGH (ref 70–99)
Glucose-Capillary: 120 mg/dL — ABNORMAL HIGH (ref 70–99)
Glucose-Capillary: 124 mg/dL — ABNORMAL HIGH (ref 70–99)

## 2012-07-13 LAB — MAGNESIUM: Magnesium: 2.4 mg/dL (ref 1.5–2.5)

## 2012-07-13 MED ORDER — SODIUM CHLORIDE 0.9 % IV SOLN
INTRAVENOUS | Status: AC
  Administered 2012-07-13: 09:00:00 via INTRAVENOUS

## 2012-07-13 MED ORDER — HEPARIN (PORCINE) IN NACL 100-0.45 UNIT/ML-% IJ SOLN
800.0000 [IU]/h | INTRAMUSCULAR | Status: DC
  Administered 2012-07-13: 1100 [IU]/h via INTRAVENOUS
  Administered 2012-07-14: 950 [IU]/h via INTRAVENOUS
  Filled 2012-07-13 (×4): qty 250

## 2012-07-13 NOTE — Procedures (Signed)
Central Venous Catheter Insertion Procedure Note Barbara Montoya [id number] August 20, 1945  Procedure: Insertion of Central Venous Catheter Indications: Assessment of intravascular volume, Drug and/or fluid administration and Frequent blood sampling  Procedure Details Consent: Risks of procedure as well as the alternatives and risks of each were explained to the (patient/caregiver).  Consent for procedure obtained. Time Out: Verified patient identification, verified procedure, site/side was marked, verified correct patient position, special equipment/implants available, medications/allergies/relevent history reviewed, required imaging and test results available.  Performed  Maximum sterile technique was used including antiseptics, cap, gloves, gown, hand hygiene, mask and sheet. Skin prep: Chlorhexidine; local anesthetic administered Real time ultrasound was used to identify the vessel and guide venipuncture A antimicrobial bonded/coated single lumen introdcuer was placed in the right internal jugular vein using the Seldinger technique.  Evaluation Blood flow good Complications: No apparent complications Patient did tolerate procedure well. Chest X-ray ordered to verify placement.  CXR: pending.  U/S used in placement.  Brenton Grills Hocutt S-ACNP 07/13/2012, 1:46 PM  I scrubbed in and performed the procedure with student.  Alyson Reedy, M.D. [facility] Pulmonary/Critical Care Medicine. Pager: [phone]. After hours pager: [phone].

## 2012-07-13 NOTE — Progress Notes (Signed)
Patient ID: Barbara Montoya, female   DOB: 1945/03/12, 67 y.o.   MRN: [id number]    SUBJECTIVE: Depressed affect.  Denies chest pain or dyspnea.  SBP in 80s-90s currently on norepi/milrinone.  I measured CVP this morning, was 11.   . antiseptic oral rinse  15 mL Mouth Rinse BID  . aspirin  325 mg Oral Daily  . atorvastatin  80 mg Oral q1800  . clopidogrel  75 mg Oral Q breakfast  . darbepoetin (ARANESP) injection - DIALYSIS  150 mcg Intravenous Q Mon-HD  . doxercalciferol  4 mcg Intravenous Q M,W,F-HD  . feeding supplement  1 Container Oral TID BM  . insulin aspart  2-6 Units Subcutaneous Q4H  . multivitamin  1 tablet Oral Daily  . pantoprazole  40 mg Oral Daily  . piperacillin-tazobactam  3.375 g Intravenous Q6H  . sodium chloride  10-40 mL Intracatheter Q12H  . vancomycin  1,000 mg Intravenous Q24H  milrinone 0.25 norepi 16    Filed Vitals:   07/13/12 0746 07/13/12 0800 07/13/12 0802 07/13/12 0807  BP:  64/40 78/45 84/47   Pulse:      Temp: 98.6 F (37 C)     TempSrc: Oral     Resp:  25 22 23   Height:      Weight:      SpO2:  96%      Intake/Output Summary (Last 24 hours) at 07/13/12 0855 Last data filed at 07/13/12 0800  Gross per 24 hour  Intake 2414.37 ml  Output   1800 ml  Net 614.37 ml    LABS: Basic Metabolic Panel:  Recent Labs  40/98/11 0449 07/12/12 1600 07/13/12 0425  NA 136 134* 135  K 4.4 4.3 4.2  CL 99 99 100  CO2 26 27 21   GLUCOSE 177* 167* 177*  BUN 15 13 11   CREATININE 1.56* 1.35* 1.34*  CALCIUM 9.3 9.0 8.7  MG 2.6*  --  2.4  PHOS 2.3 2.5 2.6   Liver Function Tests:  Recent Labs  07/12/12 1600 07/13/12 0425  AST  --  59*  ALT  --  53*  ALKPHOS  --  88  BILITOT  --  0.4  PROT  --  5.7*  ALBUMIN 2.0* 2.0*   No results found for this basename: LIPASE, AMYLASE,  in the last 72 hours CBC:  Recent Labs  07/12/12 0449 07/13/12 0425  WBC 28.1* 26.4*  HGB 9.7* 8.8*  HCT 29.2* 27.5*  MCV 71.0* 72.6*  PLT 227 207   Cardiac  Enzymes: No results found for this basename: CKTOTAL, CKMB, CKMBINDEX, TROPONINI,  in the last 72 hours BNP: No components found with this basename: POCBNP,  D-Dimer: No results found for this basename: DDIMER,  in the last 72 hours Hemoglobin A1C: No results found for this basename: HGBA1C,  in the last 72 hours Fasting Lipid Panel: No results found for this basename: CHOL, HDL, LDLCALC, TRIG, CHOLHDL, LDLDIRECT,  in the last 72 hours Thyroid Function Tests: No results found for this basename: TSH, T4TOTAL, FREET3, T3FREE, THYROIDAB,  in the last 72 hours Anemia Panel: No results found for this basename: VITAMINB12, FOLATE, FERRITIN, TIBC, IRON, RETICCTPCT,  in the last 72 hours  RADIOLOGY: Dg Chest Port 1 View  07/11/2012   *RADIOLOGY REPORT*  Clinical Data: Respiratory failure  PORTABLE CHEST - 1 VIEW  Comparison: 06/23/2012  Findings: The cardiac shadow is stable.  A left-sided jugular line and right-sided dialysis catheter are again seen and stable.  There are  persistent changes in the left lung base.  This is likely a combination of infiltrate and underlying effusion.  No new focal abnormality is seen.  IMPRESSION: Stable appearance of the chest when compared with the previous exam.   Original Report Authenticated By: Alcide Clever, M.D.   Dg Chest Port 1 View  07/10/2012   *RADIOLOGY REPORT*  Clinical Data: Shortness of breath and respiratory failure.  PORTABLE CHEST - 1 VIEW  Comparison: 07/09/2012  Findings: Dialysis catheter tip is at the junction of the superior vena cava and right atrium.  Left jugular central line in the upper SVC region.  Persistent densities at the left lung base could represent airspace disease and pleural fluid.  Heart size is stable.  No evidence for a large pneumothorax.  IMPRESSION: Persistent densities in the left lower chest.  Findings may represent a combination airspace disease and pleural fluid.  Support apparatuses as described.   Original Report  Authenticated By: Richarda Overlie, M.D.   Dg Chest Port 1 View  07/09/2012   *RADIOLOGY REPORT*  Clinical Data: Shortness of breath  PORTABLE CHEST - 1 VIEW  Comparison: 1 day prior  Findings: Right IJ dialysis catheter unchanged.  Left internal jugular line terminates at mid SVC.  Apical lordotic patient positioning. Cardiomegaly accentuated by AP portable technique.  Development of a small left pleural effusion. No pneumothorax.  Left base air space disease.  IMPRESSION: Development of left base air space disease and adjacent pleural fluid.  This airspace disease could represent atelectasis or infection/aspiration.  Asymmetric alveolar pulmonary edema felt less likely.   Original Report Authenticated By: Jeronimo Greaves, M.D.   Dg Chest Port 1 View  07/08/2012   *RADIOLOGY REPORT*  Clinical Data: Infiltrates  PORTABLE CHEST - 1 VIEW  Comparison: 07/03/2012  Findings: Endotracheal and NG tubes removed.  Bilateral internal jugular central venous catheters stable.  Bibasilar hypoaeration is unchanged.  No pneumothorax.  IMPRESSION: Extubated.  Stable bibasilar atelectasis.   Original Report Authenticated By: Jolaine Click, M.D.   Dg Chest Port 1 View  06/21/2012   *RADIOLOGY REPORT*  Clinical Data: Check ET tube position.Respiratory failure.  PORTABLE CHEST - 1 VIEW  Comparison: 07/08/2012  Findings: Support devices are unchanged.  Bibasilar atelectasis or infiltrates, similar to prior study.  Improving aeration in the left upper lobe.  Suspect trace effusions.  Heart is normal size.  IMPRESSION: Improving aeration in the left upper lobe.  Bibasilar atelectasis, trace effusions.   Original Report Authenticated By: Charlett Nose, M.D.   Dg Chest Port 1 View  06/29/2012   *RADIOLOGY REPORT*  Clinical Data: Intubation, central line placement, hypertension, diabetes  PORTABLE CHEST - 1 VIEW  Comparison: None.  Findings: Endotracheal tube 6 cm above the carina.  Right IJ dialysis catheter tips in the SVC.  Left IJ central  line tip in the mid SVC as well.  Normal heart size and vascularity.  Ill-defined increased opacity throughout the left central lung could represent developing airspace disease versus asymmetric edema.  Right lung relatively clear.  No effusion or pneumothorax. NG tube extends below the hemidiaphragms the tip not visualized.  IMPRESSION: Support apparatus in good position.  No pneumothorax  Ill-defined increased opacity left central lung could represent developing airspace process or asymmetric edema   Original Report Authenticated By: Judie Petit. Miles Costain, M.D.    PHYSICAL EXAM General: NAD Neck: JVP 8 cm, no thyromegaly or thyroid nodule.  Lungs: Clear to auscultation bilaterally with normal respiratory effort. CV: Nondisplaced PMI.  Heart mildly  tachy, regular S1/S2, no S3/S4, I can hear a friction rub.  No peripheral edema.  No carotid bruit.  Normal pedal pulses.  Abdomen: Soft, nontender, no hepatosplenomegaly, no distention.  Neurologic: Alert and oriented x 3.  Psych:Depressed affect. Extremities: No clubbing or cyanosis.   TELEMETRY: Reviewed telemetry pt in sinus tachycardia  ASSESSMENT AND PLAN: 67 yo with history of CKD and CAD presented initially to [facility] with MI.  Now in cardiogenic shock with AKI on CRRT.  She additionally has a LLL PNA.  1. Hypotension: On milrinone and norepinephrine.  CVP up to 11 today, was low in the past.  Suspect cardiogenic shock, concern also for component of septic shock.   - Go up on norepinephrine as needed today - Will arrange for PA catheter today, will leave in place.  2. CAD: s/p MI, cardiogenic shock, EF 25%.  3VD but not CABG candidate at this time.  Possible high risk PCI but need to stabilize first.  Continue ASA, heparin, Plavix, statin.  3. Renal: AKI on CKD, now on CRRT per renal. 4. PNA: LLL.  Vanco/zosyn.  As above, concerned for possible septic shock component.   Marca Ancona 07/13/2012 9:05 AM

## 2012-07-13 NOTE — Progress Notes (Signed)
Hypoglycemic Event  CBG: 69;  Repeat = 67  Treatment: 3 glucose tabs  Symptoms: None  Follow-up CBG: Time:0009 CBG Result:87  Possible Reasons for Event: Inadequate meal intake  Comments/MD notified:in no acute distress    Ruffin Pyo  Remember to initiate Hypoglycemia Order Set & complete

## 2012-07-13 NOTE — Progress Notes (Signed)
ANTICOAGULATION CONSULT NOTE - Follow Up Consult  Pharmacy Consult for heparin Indication: late-presentation MI  Labs:  Recent Labs  07/11/12 0350  07/12/12 0449 07/12/12 1600 07/13/12 0425 07/13/12 0508  HGB 9.7*  --  9.7*  --  8.8*  --   HCT 28.8*  --  29.2*  --  27.5*  --   PLT 194  --  227  --  207  --   HEPARINUNFRC 0.29*  --  0.52  --  1.06* 1.38*  CREATININE  --   < > 1.56* 1.35* 1.34*  --   < > = values in this interval not displayed.   Assessment: 67yo female supratherapeutic on heparin after one level at goal; of note lab could not stick pt peripherally, initial lab and confirmatory level drawn from central line.  Goal of Therapy:  Heparin level 0.3-0.7 units/ml   Plan:  Will hold heparin gtt x65min then decrease by 2 units/kg/hr to 1100 units/hr and check level in 6hr (earlier than usual to ensure decreasing).  Vernard Gambles, PharmD, BCPS  07/13/2012,5:51 AM

## 2012-07-13 NOTE — Progress Notes (Signed)
ANTICOAGULATION CONSULT NOTE - Follow Up Consult  Pharmacy Consult for heparin Indication: late-presentation MI  Labs:  Recent Labs  07/12/12 0449 07/12/12 1600 07/13/12 0425 07/13/12 0508 07/13/12 1238 07/13/12 1600 07/13/12 2230  HGB 9.7*  --  8.8*  --  8.8*  --   --   HCT 29.2*  --  27.5*  --  27.6*  --   --   PLT 227  --  207  --  208  --   --   HEPARINUNFRC 0.52  --  1.06* 1.38*  --   --  0.71*  CREATININE 1.56* 1.35* 1.34*  --   --  1.39*  --      Assessment: 67yo female slightly supratherapeutic on heparin after resumed s/p Swan placement.  Goal of Therapy:  Heparin level 0.3-0.7 units/ml   Plan:  Will decrease heparin gtt slightly to 1000 units/hr and check level with am labs.  Vernard Gambles, PharmD, BCPS  07/13/2012,11:32 PM

## 2012-07-13 NOTE — Progress Notes (Signed)
NUTRITION FOLLOW UP  Intervention:   1.  Modify diet; once resumed, recommend Regular diet with minimal restrictions given negligible intake.  Pt requesting only orange juice and broth TID. 2.  Supplements; Continue Resource Breeze po TID, each supplement provides 250 kcal and 9 grams of protein.  3.  Pt would greatly benefit from resume of enteral nutrition given poor PO intake and poor tolerance of meals. Please consult RD for recommendations, if warranted.   Nutrition Dx:   Inadequate oral intake now related to poor appetite as evidenced by RN report of refusing meals  Goal:   Meet >/=90% estimated nutrition needs. Unmet  Monitor:   PO intake, weight trends, labs, I/O's  Assessment:   67 year old female admitted 6/25 from  following NSTEMI, c/b respiratory failure and AKI. In cardiogenic shock, new dialysis requirement.   Pt continues on CVVHD due to unstable BP.  Discussed with RN who reports pt is not hungry is refusing all offers for food.  Met with pt who states that she has no appetite and that eating nauseates her.  Pt reports she feels she can tolerate broth/strained soup and OJ.  Will recommend these items once diet is resumed. Continue oral nutrition supplement. Pt would benefit from nutrition support.  Height: Ht Readings from Last 1 Encounters:  06/21/2012 5\' 6"  (1.676 m)    Weight Status:   Wt Readings from Last 1 Encounters:  07/13/12 178 lb 9.2 oz (81 kg)  Body mass index is 28.84 kg/(m^2).  Admission wt: 185 lbs  Re-estimated needs:  Kcal: 2000-2200 Protein: 115-125 gm  Fluid: per MD  Skin: intact   Diet Order: NPO   Intake/Output Summary (Last 24 hours) at 07/13/12 1044 Last data filed at 07/13/12 1000  Gross per 24 hour  Intake 2384.44 ml  Output   1876 ml  Net 508.44 ml    Last BM: 7/2   Labs:   Recent Labs Lab 07/11/12 0350  07/12/12 0449 07/12/12 1600 07/13/12 0425  NA  --   < > 136 134* 135  K  --   < > 4.4 4.3 4.2  CL   --   < > 99 99 100  CO2  --   < > 26 27 21   BUN  --   < > 15 13 11   CREATININE  --   < > 1.56* 1.35* 1.34*  CALCIUM  --   < > 9.3 9.0 8.7  MG 2.5  --  2.6*  --  2.4  PHOS  --   < > 2.3 2.5 2.6  GLUCOSE  --   < > 177* 167* 177*  < > = values in this interval not displayed.  CBG (last 3)   Recent Labs  07/13/12 0009 07/13/12 0419 07/13/12 0747  GLUCAP 87 184* 120*    Scheduled Meds: . antiseptic oral rinse  15 mL Mouth Rinse BID  . aspirin  325 mg Oral Daily  . atorvastatin  80 mg Oral q1800  . clopidogrel  75 mg Oral Q breakfast  . darbepoetin (ARANESP) injection - DIALYSIS  150 mcg Intravenous Q Mon-HD  . doxercalciferol  4 mcg Intravenous Q M,W,F-HD  . feeding supplement  1 Container Oral TID BM  . insulin aspart  2-6 Units Subcutaneous Q4H  . multivitamin  1 tablet Oral Daily  . pantoprazole  40 mg Oral Daily  . piperacillin-tazobactam  3.375 g Intravenous Q6H  . sodium chloride  10-40 mL Intracatheter Q12H  .  vancomycin  1,000 mg Intravenous Q24H    Continuous Infusions: . sodium chloride 10 mL/hr at 07/11/12 0800  . sodium chloride 10 mL/hr at 07/12/12 0800  . sodium chloride 10 mL/hr at 07/12/12 0800  . sodium chloride 250 mL/hr at 07/12/12 1530  . heparin Stopped (07/13/12 0941)  . milrinone 0.25 mcg/kg/min (07/13/12 0606)  . norepinephrine (LEVOPHED) Adult infusion 16 mcg/min (07/13/12 0808)  . dialysis solution (prismaSATE) +/- additives 1,500 mL/hr at 07/13/12 0923  . dialysis solution (prismaSATE) +/- additives 800 mL/hr at 07/13/12 0714  . dialysis solution (prismaSATE) +/- additives 200 mL/hr at 07/13/12 0726   Loyce Dys, MS RD LDN Clinical Inpatient Dietitian Pager: [phone] Weekend/After hours pager: [phone]

## 2012-07-13 NOTE — Progress Notes (Addendum)
Subjective: BP down overnight.   Objective: Vital signs in last 24 hours: Temp:  [97.1 F (36.2 C)-98.6 F (37 C)] 98.6 F (37 C) (07/03 0746) Pulse Rate:  [107-123] 115 (07/03 0700) Resp:  [17-27] 24 (07/03 0700) BP: (74-151)/(40-73) 94/54 mmHg (07/03 0700) SpO2:  [94 %-100 %] 95 % (07/03 0700) Weight:  [178 lb 9.2 oz (81 kg)] 178 lb 9.2 oz (81 kg) (07/03 0449) Weight change: -2 lb 3.3 oz (-1 kg)  Intake/Output from previous day: 07/02 0701 - 07/03 0700 In: 2646.2 [P.O.:510; I.V.:1736.2; IV Piggyback:400] Out: 1880 [Emesis/NG output:200; Stool:200] Intake/Output this shift:    General appearance: alert, cooperative and pale Resp: rales bibasilar Cardio: systolic murmur: holosystolic 2/6, blowing at apex GI: +BS, liver down 4 cm Extremities: extremities normal, atraumatic, no cyanosis or edema  Lab Results:  Recent Labs  07/12/12 0449 07/13/12 0425  WBC 28.1* 26.4*  HGB 9.7* 8.8*  HCT 29.2* 27.5*  PLT 227 207   BMET:   Recent Labs  07/12/12 1600 07/13/12 0425  NA 134* 135  K 4.3 4.2  CL 99 100  CO2 27 21  GLUCOSE 167* 177*  BUN 13 11  CREATININE 1.35* 1.34*  CALCIUM 9.0 8.7    Recent Labs  07/10/12 0959  PTH 1094.2*   Iron Studies: No results found for this basename: IRON, TIBC, TRANSFERRIN, FERRITIN,  in the last 72 hours  Studies/Results: No results found.  I have reviewed the patient's current medications.  Assessment/Plan: 1 CRF CVP improved with boluses, dropped overnight, and bp down.  Will bolus and continue to keep even.  Leukocytosis improving. Mild acidemia with gap 14. K stable. 2 Anemia stable 3 HPTH use Vit D 4 DM controlled 5 CAD per cards P CVVH, BOLUS, try to keep even, Vit D    LOS: 8 days    Genelle Gather, MD Internal Medicine Resident, PGY II [facility] Internal Medicine Program 07/13/2012 8:00 AM   I have seen and examined this patient and agree with the plan of care  Seen , examined, discussed with resident,  nursing staff, and CCM.Marland Kitchen Will try a little more vol. Very concerned overall status is deteriorating.  ? WBC.  Will also do F/U Hb to make sure not falling. .  Weldon Nouri L 07/13/2012, 11:28 AM

## 2012-07-13 NOTE — Procedures (Signed)
Pulmonary Artery Catheter Insertion Procedure Note Lowell Mcgurk [id number] 1945-12-01  Procedure: Insertion of Pulmonary Artery Catheter  Indications: Assessment of intravascular volume and Guide hemodynamic management  Procedure Details Consent: Risks of procedure as well as the alternatives and risks of each were explained to the (patient/caregiver).  Consent for procedure obtained. Time Out: Verified patient identification, verified procedure, site/side was marked, verified correct patient position, special equipment/implants available, medications/allergies/relevent history reviewed, required imaging and test results available.  Performed  Description of Procedure Maximum sterile technique was used including antiseptics, cap, gloves, gown, hand hygiene, mask and sheet. Skin prep: Chlorhexidine; local anesthetic administered Pulmonary Artery Catheter was placed in the right internal jugular vein; introducer inserted over guidewire, initial position assessed by monitoring pressure waveform and catheter advanced after balloon inflation.  Evaluation Pressure waveform tracings: damped, Pulmonary capillary wedge tracing: good, wedge tracing obtained after inflation of balloon with 1.25 - 1.5 cc. Complications: No apparent complications Patient did tolerate procedure well. Chest X-ray ordered to verify placement.  CXR: pending.   Brenton Grills Hocutt S-ACNP 07/13/2012  U/S used in placement.  I scrubbed in with student throughout the entire procedure.  Alyson Reedy, M.D. [facility] Pulmonary/Critical Care Medicine. Pager: [phone]. After hours pager: [phone].

## 2012-07-13 NOTE — Progress Notes (Signed)
PULMONARY  / CRITICAL CARE MEDICINE  Name: Barbara Montoya MRN: [id number] DOB: 1946/01/04    ADMISSION DATE:  06/20/2012 CONSULTATION DATE:  07/06/2012  REFERRING MD :  Kirke Corin PRIMARY SERVICE: Cardiology  CHIEF COMPLAINT:  NSTEMI  BRIEF PATIENT DESCRIPTION: 67 year old female admitted 6/25 from [facility] following NSTEMI, c/b respiratory failure and AKI. In cardiogenic shock, new dialysis requirement.   SIGNIFICANT EVENTS / STUDIES:  6/23 ECHO >>> EF 40% 6/24 Cath >>> Mid LAD 99% stenosis, Ramus intermedius 90% stenosis, Proximal RCA 70% stenosis, Mid RCA 50% stenosis, RPDA 100% stenosis, RPLS 90% stenosis .................................................................................................................................. 6/25 Admit to [facility] 6/25 TTE >> dilated LV, EF 25%, focal hypokinesis 6/29 Increased reqt for vasopressors, on CRRT, on BiPAP for refractory hypoxemia, CXR shows new LLL consolidation and effusion  LINES / TUBES: ETT 6/25 >>>6/28 L IJ TLC ([facility]) >>> R [facility] cath >>>  CULTURES: BC ([facility])>>> 6/25 UC>>> negative  ANTIBIOTICS: Vanc 6/29 >> Zosyn 6/28 >>  SUBJECTIVE / Interval events: Increased WOB, requiring BIPAP  Pressor demands increasing CRRT  Remains alert/following commands   VITAL SIGNS: Temp:  [97.1 F (36.2 C)-98.6 F (37 C)] 98.6 F (37 C) (07/03 0746) Pulse Rate:  [107-119] 117 (07/03 0730) Resp:  [17-26] 22 (07/03 1100) BP: (61-151)/(39-73) 91/50 mmHg (07/03 1100) SpO2:  [94 %-100 %] 96 % (07/03 0800) Weight:  [81 kg (178 lb 9.2 oz)] 81 kg (178 lb 9.2 oz) (07/03 0449)  HEMODYNAMICS: CVP:  [3 mmHg-12 mmHg] 12 mmHg  VENTILATOR SETTINGS:    INTAKE / OUTPUT: Intake/Output     07/02 0701 - 07/03 0700 07/03 0701 - 07/04 0700   P.O. 510    I.V. (mL/kg) 1736.2 (21.4) 427.8 (5.3)   IV Piggyback 400    Total Intake(mL/kg) 2646.2 (32.7) 427.8 (5.3)   Urine (mL/kg/hr)     Emesis/NG output 200 (0.1)    Other 1480 (0.8) 135 (0.4)    Stool 200 (0.1)    Total Output 1880 135   Net +766.2 +292.8         PHYSICAL EXAMINATION: General:  RASS +1, off BiPAP Neuro:  PERRL, following commands, MAEs HEENT:  No scleral icterus Cardiovascular:  RRR s M Lungs:  CTA bilateral anterior Abdomen:  Round, soft, non-tender. Active BS Musculoskeletal:  MAE well and equal Skin:  Pink, warm, dry and intact  LABS:  Recent Labs Lab 07/07/12 0500  07/08/12 0625  07/09/12 0607  07/10/12 0420  07/11/12 0350  07/12/12 0449 07/12/12 1600 07/13/12 0425  HGB 10.0*  < > 8.1*  < >  --   --  8.8*  --  9.7*  --  9.7*  --  8.8*  WBC 19.8*  < > 17.0*  < >  --   --  30.2*  --  26.5*  --  28.1*  --  26.4*  PLT 151  < > 146*  < >  --   --  167  --  194  --  227  --  207  NA 135  < >  --   < >  --   < > 133*  < >  --   < > 136 134* 135  K 3.9  < >  --   < >  --   < > 3.4*  < >  --   < > 4.4 4.3 4.2  CL 99  < >  --   < >  --   < > 95*  < >  --   < >  99 99 100  CO2 25  < >  --   < >  --   < > 21  < >  --   < > 26 27 21   GLUCOSE 173*  < >  --   < >  --   < > 151*  < >  --   < > 177* 167* 177*  BUN 19  < >  --   < >  --   < > 39*  < >  --   < > 15 13 11   CREATININE 1.87*  < >  --   < >  --   < > 3.05*  < >  --   < > 1.56* 1.35* 1.34*  CALCIUM 8.5  < >  --   < >  --   < > 9.2  < >  --   < > 9.3 9.0 8.7  MG  --   --   --   --   --   < > 2.5  --  2.5  --  2.6*  --  2.4  PHOS 2.7  < >  --   < >  --   < > 3.9  < >  --   < > 2.3 2.5 2.6  AST  --   --   --   --   --   --   --   --   --   --   --   --  59*  ALT  --   --   --   --   --   --   --   --   --   --   --   --  72*  ALKPHOS  --   --   --   --   --   --   --   --   --   --   --   --  88  BILITOT  --   --   --   --   --   --   --   --   --   --   --   --  0.4  PROT  --   --   --   --   --   --   --   --   --   --   --   --  5.7*  ALBUMIN 2.4*  < >  --   < >  --   < > 2.3*  < >  --   < > 2.1* 2.0* 2.0*  TROPONINI  --   --  17.43*  --   --   --   --   --   --   --   --   --   --   PROCALCITON  3.03  --   --   --   --   --  4.18  --   --   --   --   --   --   PHART  --   --   --   --  7.387  --   --   --   --   --   --   --   --   PCO2ART  --   --   --   --  27.7*  --   --   --   --   --   --   --   --  PO2ART  --   --   --   --  78.0*  --   --   --   --   --   --   --   --   < > = values in this interval not displayed.  Recent Labs Lab 07/12/12 2347 07/12/12 2349 07/13/12 0009 07/13/12 0419 07/13/12 0747  GLUCAP 69* 67* 87 184* 120*   CXR: 6/29 >  Development of left base air space disease and adjacent pleural  fluid.  ASSESSMENT / PLAN:  PULMONARY A:  Acute respiratory failure - due to pulmonary edema LL Opacity - favor  infiltrate, w/ fever and rising wbc  6/29>increased WOB w/ more fio2 demands requiring BIPAP support  P:   - Change BiPAP to QHS. - Pulm hygiene w/ xopenex as needed.  - Cont on IV abx.  CARDIOVASCULAR A:  NSTEMI Shock, presumed cardiogenic; LVEF 25%, dilated LV Coronary Artery Disease multivessel disease evident on cath, no change on repeat at [facility] 6/25 Hyperlipidemia chronic Hx HTN  7/2>>Increase pressors demand and increase HR after CRRT negative.  Likely volume depleted. 7/3>>>Worsening hypotension, now more volume repleted but remains very hypotensive with increased pressors demand, spoke with cardiology, recommend a swan to assess cardiac function.  P:  - Titrate Levophed for SBP of 80. - Appreciate Dr vanTrigt's eval, not a candidate for CABG at this time. Consider PTCI instead when more stable. - ASA, lipitor, heparin. - Will place PAC today after holding heparin.  RENAL A:   Stage V CKD baseline Cr 4.6 P:   - CRRT volume even at this point. - Avoid nephrotoxic medications. - Renal following.  GASTROINTESTINAL A:  No active issues P:   - Continue diet. - Protonix for SUP.  HEMATOLOGIC A:   Leukocytosis d/t stres/MI / infxn  Coagulopathy due to heparin infusion   Iron deficiency anemia chronic >>S/p 1 u PRBC 6/28   P:  - Continue ferrous sulfate. - Follow CBC. - Heparin, no need for SCD's while on gtt  INFECTIOUS A:   Leukocytosis Maia Plan /LLL infiltrate suspect new left sided PNA  P:   - Follow WBC - BC NTD - Cont vanc /zosyn   ENDOCRINE A:   Type 2 DM with hyperglycemia   P:   - CBG q4h - SSI  NEUROLOGIC A:   Acute encephalopathy due to critical illness >improved  P:   - Monitor.  TODAY'S SUMMARY: Mrs. Reitano presents following NSTEMI with significant CAD. EF 25% She also has baseline CKD stage V.  . On CRRT for volume even.  Cardiology guiding either PTCI or CABG. Has multi system failure but remains alert and coherent.  Need EOL discussion as I highly doubt patient will improve or get out of the hospital, family insists on continued aggressive care.   CRITICAL CARE: The patient is critically ill with multiple organ systems failure and requires high complexity decision making for assessment and support, frequent evaluation and titration of therapies, application of advanced monitoring technologies and extensive interpretation of multiple databases. Critical Care Time devoted to patient care services described in this note is 35 minutes.   Alyson Reedy, M.D. [facility] Pulmonary/Critical Care Medicine. Pager: [phone]. After hours pager: [phone].

## 2012-07-14 DIAGNOSIS — I319 Disease of pericardium, unspecified: Secondary | ICD-10-CM

## 2012-07-14 LAB — RENAL FUNCTION PANEL
Albumin: 2.1 g/dL — ABNORMAL LOW (ref 3.5–5.2)
CO2: 24 mEq/L (ref 19–32)
Calcium: 8.9 mg/dL (ref 8.4–10.5)
Chloride: 101 mEq/L (ref 96–112)
Creatinine, Ser: 0.88 mg/dL (ref 0.50–1.10)
GFR calc Af Amer: 47 mL/min — ABNORMAL LOW (ref >90)
GFR calc Af Amer: 77 mL/min — ABNORMAL LOW (ref >90)
GFR calc non Af Amer: 40 mL/min — ABNORMAL LOW (ref >90)
GFR calc non Af Amer: 66 mL/min — ABNORMAL LOW (ref >90)
Glucose, Bld: 118 mg/dL — ABNORMAL HIGH (ref 70–99)
Phosphorus: 1.6 mg/dL — ABNORMAL LOW (ref 2.3–4.6)
Sodium: 136 mEq/L (ref 135–145)
Sodium: 136 mEq/L (ref 135–145)

## 2012-07-14 LAB — CBC
HCT: 26 % — ABNORMAL LOW (ref 36.0–46.0)
MCH: 24.1 pg — ABNORMAL LOW (ref 26.0–34.0)
MCV: 72.8 fL — ABNORMAL LOW (ref 78.0–100.0)
Platelets: 213 10*3/uL (ref 150–400)
RBC: 3.57 MIL/uL — ABNORMAL LOW (ref 3.87–5.11)

## 2012-07-14 LAB — CULTURE, BLOOD (ROUTINE X 2): Culture: NO GROWTH

## 2012-07-14 LAB — CLOSTRIDIUM DIFFICILE BY PCR: Toxigenic C. Difficile by PCR: NEGATIVE

## 2012-07-14 LAB — GLUCOSE, CAPILLARY
Glucose-Capillary: 137 mg/dL — ABNORMAL HIGH (ref 70–99)
Glucose-Capillary: 122 mg/dL — ABNORMAL HIGH (ref 70–99)
Glucose-Capillary: 140 mg/dL — ABNORMAL HIGH (ref 70–99)

## 2012-07-14 LAB — VANCOMYCIN, TROUGH: Vancomycin Tr: 14.9 ug/mL (ref 10.0–20.0)

## 2012-07-14 LAB — MAGNESIUM: Magnesium: 2.6 mg/dL — ABNORMAL HIGH (ref 1.5–2.5)

## 2012-07-14 MED ORDER — SODIUM PHOSPHATE 3 MMOLE/ML IV SOLN
20.0000 mmol | Freq: Once | INTRAVENOUS | Status: DC
  Filled 2012-07-14: qty 6.67

## 2012-07-14 MED ORDER — CHLORHEXIDINE GLUCONATE 0.12 % MT SOLN
15.0000 mL | Freq: Two times a day (BID) | OROMUCOSAL | Status: DC
  Administered 2012-07-14: 15 mL via OROMUCOSAL
  Filled 2012-07-14: qty 15

## 2012-07-14 MED ORDER — SODIUM GLYCEROPHOSPHATE 1 MMOLE/ML IV SOLN
20.0000 mmol | Freq: Once | INTRAVENOUS | Status: AC
  Administered 2012-07-14: 20 mmol via INTRAVENOUS
  Filled 2012-07-14: qty 20

## 2012-07-14 NOTE — Progress Notes (Addendum)
ANTICOAGULATION and ANTIBIOTIC CONSULT NOTE - Follow Up Consult  Pharmacy Consult for heparin and vancomycin Indication: late-presentation MI and r/o PNA  Labs:  Recent Labs  07/13/12 0425  07/13/12 1238 07/13/12 1600 07/13/12 2230 07/14/12 0410 07/14/12 1550 07/14/12 2240  HGB 8.8*  --  8.8*  --   --  8.6*  --   --   HCT 27.5*  --  27.6*  --   --  26.0*  --   --   PLT 207  --  208  --   --  213  --   --   HEPARINUNFRC 1.06*  < >  --   --  0.71* 0.71*  --  0.82*  CREATININE 1.34*  --   --  1.39*  --  1.33* 0.88  --   < > = values in this interval not displayed.   Assessment: 67yo female remains slightly supratherapeutic on heparin after rate decreases, now with higher level despite lower rate.  Therapeutic on vancomycin with trough ~15.  Goal of Therapy:  Heparin level 0.3-0.7 units/ml Vancomycin trough 15-20   Plan:  Will decrease heparin gtt by 2 units/kg/hr to 800 units/hr and check level with am labs.  Will continue vanc with no changes and continue to monitor.  Vernard Gambles, PharmD, BCPS  07/14/2012,11:23 PM

## 2012-07-14 NOTE — Progress Notes (Signed)
eLink Physician-Brief Progress Note Patient Name: Barbara Montoya DOB: [birth year] MRN: [id number]  Date of Service  07/14/2012   HPI/Events of Note   Hypothermic  eICU Interventions  Bear hugger ordered      YACOUB,WESAM 07/14/2012, 8:17 PM

## 2012-07-14 NOTE — Progress Notes (Signed)
Patient ID: Barbara Montoya, female   DOB: [birth year], 66 y.o.   MRN: [id number]    SUBJECTIVE:  Barbara Montoya in place. Remains on CVVHD (keeping even)  Now requiring fairly high-dose levophed (20 mcg/kg/min) and milrinone (0.375 mc/kg/min) to keep hemodynamics stable.   Renal and nursing staff noticing more pronounced murmur. Denies CP or SB  Swan numbers this am  CVP 5 PAP 32/21 (29) PCWP 21 CO/CI 3.5/1.8 SVR 1363   . antiseptic oral rinse  15 mL Mouth Rinse BID  . aspirin  325 mg Oral Daily  . atorvastatin  80 mg Oral q1800  . clopidogrel  75 mg Oral Q breakfast  . darbepoetin (ARANESP) injection - DIALYSIS  150 mcg Intravenous Q Mon-HD  . doxercalciferol  4 mcg Intravenous Q M,W,F-HD  . feeding supplement  1 Container Oral TID BM  . insulin aspart  2-6 Units Subcutaneous Q4H  . multivitamin  1 tablet Oral Daily  . pantoprazole  40 mg Oral Daily  . piperacillin-tazobactam  3.375 g Intravenous Q6H  . sodium chloride  10-40 mL Intracatheter Q12H  . vancomycin  1,000 mg Intravenous Q24H  milrinone 0.375 norepi 20    Filed Vitals:   07/14/12 0800 07/14/12 0804 07/14/12 0900 07/14/12 1000  BP: 85/53  87/52 102/57  Pulse: 114 113 110 112  Temp: 97.2 F (36.2 C) 97.2 F (36.2 C) 97.2 F (36.2 C) 96.8 F (36 C)  TempSrc: Core (Comment)  Core (Comment) Core (Comment)  Resp: 23 23 24 22   Height:      Weight:      SpO2: 93% 92% 98% 94%    Intake/Output Summary (Last 24 hours) at 07/14/12 1057 Last data filed at 07/14/12 1000  Gross per 24 hour  Intake 2163.13 ml  Output   1928 ml  Net 235.13 ml    LABS: Basic Metabolic Panel:  Recent Labs  40/98/11 0425 07/13/12 1600 07/14/12 0410  NA 135 137 136  K 4.2 4.4 4.6  CL 100 101 101  CO2 21 24 24   GLUCOSE 177* 130* 137*  BUN 11 13 12   CREATININE 1.34* 1.39* 1.33*  CALCIUM 8.7 8.7 8.9  MG 2.4  --  2.6*  PHOS 2.6 3.9 2.5   Liver Function Tests:  Recent Labs  07/13/12 0425 07/13/12 1600 07/14/12 0410  AST  59*  --   --   ALT 53*  --   --   ALKPHOS 88  --   --   BILITOT 0.4  --   --   PROT 5.7*  --   --   ALBUMIN 2.0* 1.9* 2.1*   No results found for this basename: LIPASE, AMYLASE,  in the last 72 hours CBC:  Recent Labs  07/13/12 1238 07/14/12 0410  WBC 28.6* 31.6*  HGB 8.8* 8.6*  HCT 27.6* 26.0*  MCV 72.8* 72.8*  PLT 208 213   Cardiac Enzymes: No results found for this basename: CKTOTAL, CKMB, CKMBINDEX, TROPONINI,  in the last 72 hours BNP: No components found with this basename: POCBNP,  D-Dimer: No results found for this basename: DDIMER,  in the last 72 hours Hemoglobin A1C: No results found for this basename: HGBA1C,  in the last 72 hours Fasting Lipid Panel: No results found for this basename: CHOL, HDL, LDLCALC, TRIG, CHOLHDL, LDLDIRECT,  in the last 72 hours Thyroid Function Tests: No results found for this basename: TSH, T4TOTAL, FREET3, T3FREE, THYROIDAB,  in the last 72 hours Anemia Panel: No results found for this basename:  VITAMINB12, FOLATE, FERRITIN, TIBC, IRON, RETICCTPCT,  in the last 72 hours  RADIOLOGY: Dg Chest Port 1 View  07/11/2012   *RADIOLOGY REPORT*  Clinical Data: Respiratory failure  PORTABLE CHEST - 1 VIEW  Comparison: 06/23/2012  Findings: The cardiac shadow is stable.  A left-sided jugular line and right-sided dialysis catheter are again seen and stable.  There are persistent changes in the left lung base.  This is likely a combination of infiltrate and underlying effusion.  No new focal abnormality is seen.  IMPRESSION: Stable appearance of the chest when compared with the previous exam.   Original Report Authenticated By: Alcide Clever, M.D.   Dg Chest Port 1 View  07/10/2012   *RADIOLOGY REPORT*  Clinical Data: Shortness of breath and respiratory failure.  PORTABLE CHEST - 1 VIEW  Comparison: 07/09/2012  Findings: Dialysis catheter tip is at the junction of the superior vena cava and right atrium.  Left jugular central line in the upper SVC  region.  Persistent densities at the left lung base could represent airspace disease and pleural fluid.  Heart size is stable.  No evidence for a large pneumothorax.  IMPRESSION: Persistent densities in the left lower chest.  Findings may represent a combination airspace disease and pleural fluid.  Support apparatuses as described.   Original Report Authenticated By: Richarda Overlie, M.D.   Dg Chest Port 1 View  07/09/2012   *RADIOLOGY REPORT*  Clinical Data: Shortness of breath  PORTABLE CHEST - 1 VIEW  Comparison: 1 day prior  Findings: Right IJ dialysis catheter unchanged.  Left internal jugular line terminates at mid SVC.  Apical lordotic patient positioning. Cardiomegaly accentuated by AP portable technique.  Development of a small left pleural effusion. No pneumothorax.  Left base air space disease.  IMPRESSION: Development of left base air space disease and adjacent pleural fluid.  This airspace disease could represent atelectasis or infection/aspiration.  Asymmetric alveolar pulmonary edema felt less likely.   Original Report Authenticated By: Jeronimo Greaves, M.D.   Dg Chest Port 1 View  07/08/2012   *RADIOLOGY REPORT*  Clinical Data: Infiltrates  PORTABLE CHEST - 1 VIEW  Comparison: 07/03/2012  Findings: Endotracheal and NG tubes removed.  Bilateral internal jugular central venous catheters stable.  Bibasilar hypoaeration is unchanged.  No pneumothorax.  IMPRESSION: Extubated.  Stable bibasilar atelectasis.   Original Report Authenticated By: Jolaine Click, M.D.   Dg Chest Port 1 View  07/01/2012   *RADIOLOGY REPORT*  Clinical Data: Check ET tube position.Respiratory failure.  PORTABLE CHEST - 1 VIEW  Comparison: 07/04/2012  Findings: Support devices are unchanged.  Bibasilar atelectasis or infiltrates, similar to prior study.  Improving aeration in the left upper lobe.  Suspect trace effusions.  Heart is normal size.  IMPRESSION: Improving aeration in the left upper lobe.  Bibasilar atelectasis, trace  effusions.   Original Report Authenticated By: Charlett Nose, M.D.   Dg Chest Port 1 View  06/15/2012   *RADIOLOGY REPORT*  Clinical Data: Intubation, central line placement, hypertension, diabetes  PORTABLE CHEST - 1 VIEW  Comparison: None.  Findings: Endotracheal tube 6 cm above the carina.  Right IJ dialysis catheter tips in the SVC.  Left IJ central line tip in the mid SVC as well.  Normal heart size and vascularity.  Ill-defined increased opacity throughout the left central lung could represent developing airspace disease versus asymmetric edema.  Right lung relatively clear.  No effusion or pneumothorax. NG tube extends below the hemidiaphragms the tip not visualized.  IMPRESSION: Support  apparatus in good position.  No pneumothorax  Ill-defined increased opacity left central lung could represent developing airspace process or asymmetric edema   Original Report Authenticated By: Judie Petit. Miles Costain, M.D.    PHYSICAL EXAM General: NAD Neck: Swan on R. TLC on left. R subclavian dialysis cath Lungs: Clear to auscultation bilaterally with normal respiratory effort. CV: Nondisplaced PMI.  Heart mildly tachy, regular S1/S2, no S3/S4, 2/6 SEM at RSB appears to have contin Abdomen: Soft, nontender, no hepatosplenomegaly, no distention.  Neurologic: Alert and oriented x 3.  Psych:Depressed affect. Extremities: No clubbing or cyanosis.   TELEMETRY: Reviewed telemetry pt in sinus tachycardia  ASSESSMENT: 67 yo with history of CKD and CAD presented initially to [facility] with MI.  Now in cardiogenic shock with AKI on CRRT.  She additionally has a LLL PNA.  1. Cardiogenic shock: EF 25%   2. CAD: s/p MI  3VD but not CABG candidate at this time.  Possible high risk PCI but need to stabilize first.  Continue ASA, heparin, Plavix, statin.  3. Renal: AKI on CKD, now on CRRT per renal. 4. PNA: LLL.  Vanco/zosyn.  As above, concerned for possible septic shock component.  5. New murmur  PLAN/DISCUSSION:  Very difficult  situation. She is requiring dual pressors to main her hemodynamics even at a marginal level. That said, aside from her cardiac output, her swan numbers are optimized. At this point I just don't see a feasible solution for [place] to maintain her out of the ICU particularly in a state that she would be able to tolerate high-risk PCI or chronic HD.  I suspect only viable option at this point is going to be palliative care. I am going to check echo today to make sure she does not have a post-MI VSD. Will attempt to wean levophed as tolerated over the weekend but I am not optimistic about our chances here. With renal failure she is not VAD or Tx candidate.  Shoot to keep PCWP 18-22 range with CVVHD. No b-blocker or ACE due to shock and renal failure  I discussed with patient, her daughter and 2 granddaughters.  The patient is critically ill with multiple organ systems failure and requires high complexity decision making for assessment and support, frequent evaluation and titration of therapies, application of advanced monitoring technologies and extensive interpretation of multiple databases.   Critical Care Time devoted to patient care services described in this note is 35 Minutes.  Reuel Boom Hadasa Gasner 07/14/2012 10:57 AM

## 2012-07-14 NOTE — Progress Notes (Signed)
ANTICOAGULATION & ANTIBIOTIC CONSULT NOTE - Follow Up Consult  Pharmacy Consult for Heparin & Vancomycin + Zosyn Indication: NSTEMI, 3VCAD, awaiting PCI & empiric HCAP coverage  No Known Allergies  Patient Measurements: Height: 5\' 6"  (167.6 cm) Weight: 180 lb 8.9 oz (81.9 kg) IBW/kg (Calculated) : 59.3 Heparin Dosing Weight: 76 kg  Vital Signs: Temp: 97.2 F (36.2 C) (07/04 0700) Temp src: Core (Comment) (07/04 0700) BP: 88/53 mmHg (07/04 0700) Pulse Rate: 114 (07/04 0700)  Labs:  Recent Labs  07/13/12 0425 07/13/12 0508 07/13/12 1238 07/13/12 1600 07/13/12 2230 07/14/12 0410  HGB 8.8*  --  8.8*  --   --  8.6*  HCT 27.5*  --  27.6*  --   --  26.0*  PLT 207  --  208  --   --  213  HEPARINUNFRC 1.06* 1.38*  --   --  0.71* 0.71*  CREATININE 1.34*  --   --  1.39*  --  1.33*    Estimated Creatinine Clearance: 44.3 ml/min (by C-G formula based on Cr of 1.33).   Assessment: 69 YOF admitted with NSTEMI and found via cath to have severe 3vCAD -- not a CABG candidate, so awaiting PCI when stable. Heparin level this morning remains slightly SUPRAtherapeutic however only reflects ~5 hours on new rate change from yesterday evening. Hgb/Hct low but stable, plts wnl. No s/sx of bleeding noted per nurse report.   The patient also continues on Vancomcyin + Zosyn for empiric HCAP coverage. The patient has low temps likely related to CRRT. WBC continues to elevate despite antibiotics (pt is not on steroids). The patient remains on CVVHDF and is tolerating appropriately.   Goal of Therapy:  Heparin level 0.3-0.7 units/ml Monitor platelets by anticoagulation protocol: Yes   Plan:  1. Reduce heparin drip rate slightly to 950 units/hr (9.5 ml/hr) 2. Continue Vancomycin 1g IV every 24 hours 3. Continue Zosyn 3.375g IV every 6 hours 4. Will obtain a Vancomycin trough this evening to further evaluate appropriate dose 5. Will continue to monitor for any signs/symptoms of bleeding and will  follow up with heparin level in 8 hours  6. Will continue to follow renal function, culture results, LOT, and antibiotic de-escalation plans   Georgina Pillion, PharmD, BCPS Clinical Pharmacist Pager: [phone] 07/14/2012 7:46 AM

## 2012-07-14 NOTE — Progress Notes (Signed)
PULMONARY  / CRITICAL CARE MEDICINE  Name: Barbara Montoya MRN: [id number] DOB: 1945-11-18    ADMISSION DATE:  06/23/2012 CONSULTATION DATE:  06/30/2012  REFERRING MD :  Kirke Corin PRIMARY SERVICE: Cardiology  CHIEF COMPLAINT:  NSTEMI  BRIEF PATIENT DESCRIPTION: 67 year old female admitted 6/25 from [place] following NSTEMI, c/b respiratory failure and AKI. In cardiogenic shock, new dialysis requirement.   SIGNIFICANT EVENTS / STUDIES:  6/23 ECHO >>> EF 40% 6/24 Cath >>> Mid LAD 99% stenosis, Ramus intermedius 90% stenosis, Proximal RCA 70% stenosis, Mid RCA 50% stenosis, RPDA 100% stenosis, RPLS 90% stenosis .................................................................................................................................. 6/25 Admit to [facility] 6/25 TTE >> dilated LV, EF 25%, focal hypokinesis 6/29 Increased reqt for vasopressors, on CRRT, on BiPAP for refractory hypoxemia, CXR shows new LLL consolidation and effusion  LINES / TUBES: ETT 6/25 >>>6/28 L IJ TLC ([facility]) >>> R [place] HD cath >>> R IJ cordis 7/3 >>> R IJ PAC 7/3 >>>  CULTURES: BC ([facility])>>> 6/25 UC>>> negative  ANTIBIOTICS: Vanc 6/29 >> Zosyn 6/28 >>  SUBJECTIVE / Interval events: requiring BIPAP at night Pressor demands increasing CRRT  Remains alert/following commands   VITAL SIGNS: Temp:  [96.8 F (36 C)-99.5 F (37.5 C)] 96.8 F (36 C) (07/04 1000) Pulse Rate:  [58-127] 112 (07/04 1000) Resp:  [10-33] 22 (07/04 1000) BP: (69-113)/(45-64) 102/57 mmHg (07/04 1000) SpO2:  [91 %-100 %] 94 % (07/04 1000) Weight:  [81.9 kg (180 lb 8.9 oz)] 81.9 kg (180 lb 8.9 oz) (07/04 0500)  HEMODYNAMICS: PAP: (33-43)/(22-35) 35/24 mmHg CVP:  [4 mmHg-19 mmHg] 8 mmHg PCWP:  [21 mmHg-34 mmHg] 21 mmHg CO:  [2.7 L/min-3.5 L/min] 3.5 L/min CI:  [1.4 L/min/m2-1.8 L/min/m2] 1.8 L/min/m2  VENTILATOR SETTINGS:    INTAKE / OUTPUT: Intake/Output     07/03 0701 - 07/04 0700 07/04 0701 - 07/05 0700   P.O. 240 150   I.V.  (mL/kg) 1482.8 (18.1) 224 (2.7)   Other 160    IV Piggyback 400    Total Intake(mL/kg) 2282.8 (27.9) 374 (4.6)   Emesis/NG output     Other 1876 260   Stool 0    Total Output 1876 260   Net +406.8 +114        Stool Occurrence 1 x     PHYSICAL EXAMINATION: General:  RASS 0, off BiPAP Neuro:  PERRL, following commands, MAEs HEENT:  No scleral icterus Cardiovascular:  RRR s M Lungs:  CTA bilateral anterior Abdomen:  Round, soft, non-tender. Active BS Musculoskeletal:  MAE well and equal Skin:  Pink, warm, dry and intact  LABS:  Recent Labs Lab 07/13/12 0425 07/13/12 1600 07/14/12 0410  NA 135 137 136  K 4.2 4.4 4.6  CL 100 101 101  CO2 21 24 24   BUN 11 13 12   CREATININE 1.34* 1.39* 1.33*  GLUCOSE 177* 130* 137*    Recent Labs Lab 07/13/12 0425 07/13/12 1238 07/14/12 0410  HGB 8.8* 8.8* 8.6*  HCT 27.5* 27.6* 26.0*  WBC 26.4* 28.6* 31.6*  PLT 207 208 213    CXR: 7/3 >> B effusions, mild edema pattern.  ASSESSMENT / PLAN:  PULMONARY A:  Acute respiratory failure - due to pulmonary edema LL Opacity - favor  infiltrate, w/ fever and rising wbc  Acute resp failure P:   - Continue Bipap qHS - Pulm hygiene w/ xopenex as needed.  - Cont on IV abx x 7 days  CARDIOVASCULAR A:  NSTEMI Shock, presumed cardiogenic; LVEF 25%, dilated LV Coronary Artery Disease multivessel disease evident  on cath, no change on repeat at [facility] 6/25 Hyperlipidemia chronic Hx HTN  7/2>>Increase pressors demand and increase HR after CRRT negative.  Likely volume depleted. 7/3>>>Worsening hypotension, now more volume repleted but remains very hypotensive with increased pressors demand, spoke with cardiology, recommend a swan to assess cardiac function.  P:  - Titrate Levophed/milrinone for SBP of 80. - Appreciate Dr vanTrigt's eval, not a candidate for CABG at this time. Consider PTCI instead when more stable. - ASA, lipitor, heparin. - Volume reduction with CRRT - not clear  whether she is a candidate for any further intervention (ie LVAD, transplant eval, etc). Her support is maximized and PA-c numbers appear to be at goal. ? Whether we will need to discuss withdrawal of care with her.   RENAL A:   Stage V CKD baseline Cr 4.6 P:   - CRRT volume even at this point. - Avoid nephrotoxic medications. - Renal following.  GASTROINTESTINAL A:  No active issues P:   - Continue diet. - Protonix for SUP.  HEMATOLOGIC A:   Leukocytosis d/t stres/MI / infxn  Coagulopathy due to heparin infusion   Iron deficiency anemia chronic P:  - Continue ferrous sulfate. - Follow CBC. - Heparin, no need for SCD's while on gtt  INFECTIOUS A:   Leukocytosis Maia Plan /LLL infiltrate suspect new left sided PNA  P:   - Follow WBC - BC NTD - Cont vanc /zosyn x 7 days (stop 7/5)  ENDOCRINE A:   Type 2 DM with hyperglycemia   P:   - CBG q4h - SSI  NEUROLOGIC A:   Acute encephalopathy due to critical illness >improved  P:   - Monitor.  TODAY'S SUMMARY: [name] presents following NSTEMI with significant CAD. EF 25% She also has baseline CKD stage V.  . On CRRT for volume even. Will continue to follow cardiology recommendations. Need to have goals of care discussion with patient and family. Suspect there are no further interventions to be made, may need to transition to comfort  [facility] S-ACNP   Levy Pupa, MD, PhD 07/14/2012, 11:40 AM [facility] [phone] or if no answer [phone]

## 2012-07-14 NOTE — Progress Notes (Signed)
Subjective: Interval History: has complaints stom quesy but a little better.  Objective: Vital signs in last 24 hours: Temp:  [96.8 F (36 C)-99.5 F (37.5 C)] 97.2 F (36.2 C) (07/04 0804) Pulse Rate:  [58-127] 113 (07/04 0804) Resp:  [10-33] 23 (07/04 0804) BP: (61-113)/(39-64) 88/53 mmHg (07/04 0700) SpO2:  [91 %-100 %] 92 % (07/04 0804) Weight:  [81.9 kg (180 lb 8.9 oz)] 81.9 kg (180 lb 8.9 oz) (07/04 0500) Weight change: 0.9 kg (1 lb 15.8 oz)  Intake/Output from previous day: 07/03 0701 - 07/04 0700 In: 2282.8 [P.O.:240; I.V.:1482.8; IV Piggyback:400] Out: 1876  Intake/Output this shift: Total I/O In: 56 [I.V.:56] Out: 51 [Other:51]  General appearance: alert, cooperative and pale Resp: diminished breath sounds bilaterally and rales bibasilar Cardio: systolic murmur: holosystolic 3/6, blowing at apex and diastolic murmur: early diastolic 2/6, decrescendo at apex GI: pos bs,.liver down4 cm Extremities: edema 1+  Lab Results:  Recent Labs  07/13/12 1238 07/14/12 0410  WBC 28.6* 31.6*  HGB 8.8* 8.6*  HCT 27.6* 26.0*  PLT 208 213   BMET:  Recent Labs  07/13/12 1600 07/14/12 0410  NA 137 136  K 4.4 4.6  CL 101 101  CO2 24 24  GLUCOSE 130* 137*  BUN 13 12  CREATININE 1.39* 1.33*  CALCIUM 8.7 8.9   No results found for this basename: PTH,  in the last 72 hours Iron Studies: No results found for this basename: IRON, TIBC, TRANSFERRIN, FERRITIN,  in the last 72 hours  Studies/Results: Dg Chest Port 1 View  07/13/2012   *RADIOLOGY REPORT*  Clinical Data: Swan-Ganz catheter placement.  PORTABLE CHEST - 1 VIEW at 2:03 p.m.  Comparison: Chest x-ray dated 07/13/2012 at 10:30 am  Findings: Swan-Ganz catheter has been inserted and the tip is in the pulmonary artery to the right lower lobe. Two indwelling central venous catheters have tips in the superior vena cava, unchanged.  Moderate bilateral pleural effusions, slightly more prominent. Slight decreased pulmonary  vascular prominence.  Heart size is normal.  No acute osseous abnormality.  IMPRESSION: Swan-Ganz catheter tip in the right lower lobe.  Increasing effusions.  No pneumothorax. Pulmonary vascularity is slightly less prominent.   Original Report Authenticated By: Francene Boyers, M.D.   Dg Chest Port 1 View  07/13/2012   *RADIOLOGY REPORT*  Clinical Data: Pleural effusion.  CHF.  Respiratory difficulty.  PORTABLE CHEST - 1 VIEW  Comparison: 07/11/2012  Findings: Central vascular congestion and hazy perihilar lung base opacity is more prominent than on the prior exam.  More confluent opacity at the bases obscures hemidiaphragms.  The findings most suggestive of left greater right pleural effusions and pulmonary edema.  No pneumothorax.  The cardiac silhouette is normal in size.  No mediastinal or hilar masses.  Right internal jugular tunneled dual lumen central venous catheter and left internal jugular single lumen central venous catheter are stable, both with their tips in the superior vena cava.  IMPRESSION: Mild worsening lung aeration from prior exam with findings consistent with left greater right bilateral effusions and pulmonary edema.   Original Report Authenticated By: Amie Portland, M.D.    I have reviewed the patient's current medications.  Assessment/Plan: 1 SKD/AKI solute stable. Vol ok. PAD mildly ^ ,follow.  Acid/base/K ok 2 CAD Murmur definitely louder and dia component. Index poor, ^SVR 3 DM controlle 4 Anemia stable 5 HPTH Vit D P CVVHD, even, ? eval valve but consider goals, outlook poor.    LOS: 9 days  Barbara Montoya L 07/14/2012,8:43 AM

## 2012-07-14 NOTE — Progress Notes (Signed)
  Echocardiogram 2D Echocardiogram limited has been performed.  Barbara Montoya FRANCES 07/14/2012, 3:47 PM

## 2012-07-15 LAB — CARBOXYHEMOGLOBIN
Carboxyhemoglobin: 1.3 % (ref 0.5–1.5)
Methemoglobin: 1.2 % (ref 0.0–1.5)
Total hemoglobin: 7.8 g/dL — ABNORMAL LOW (ref 12.0–16.0)

## 2012-07-15 LAB — CBC
MCH: 24.3 pg — ABNORMAL LOW (ref 26.0–34.0)
MCHC: 32.6 g/dL (ref 30.0–36.0)
Platelets: 169 10*3/uL (ref 150–400)
RBC: 3.5 MIL/uL — ABNORMAL LOW (ref 3.87–5.11)
RDW: 19 % — ABNORMAL HIGH (ref 11.5–15.5)

## 2012-07-15 LAB — GLUCOSE, CAPILLARY
Glucose-Capillary: 118 mg/dL — ABNORMAL HIGH (ref 70–99)
Glucose-Capillary: 51 mg/dL — ABNORMAL LOW (ref 70–99)

## 2012-07-15 LAB — COMPREHENSIVE METABOLIC PANEL
BUN: 12 mg/dL (ref 6–23)
Calcium: 8.9 mg/dL (ref 8.4–10.5)
Creatinine, Ser: 1.29 mg/dL — ABNORMAL HIGH (ref 0.50–1.10)
GFR calc Af Amer: 49 mL/min — ABNORMAL LOW (ref >90)
Glucose, Bld: 119 mg/dL — ABNORMAL HIGH (ref 70–99)
Total Protein: 5.8 g/dL — ABNORMAL LOW (ref 6.0–8.3)

## 2012-07-15 LAB — HEPARIN LEVEL (UNFRACTIONATED): Heparin Unfractionated: 0.7 IU/mL (ref 0.30–0.70)

## 2012-07-15 MED ORDER — DEXTROSE 50 % IV SOLN
INTRAVENOUS | Status: AC
  Administered 2012-07-15: 50 mL
  Filled 2012-07-15: qty 50

## 2012-07-15 MED ORDER — DEXTROSE 50 % IV SOLN: 25.0000 mL | Freq: Once | INTRAVENOUS | Status: DC | PRN

## 2012-07-15 MED ORDER — SODIUM CHLORIDE 0.9 % IV SOLN: Freq: Once | INTRAVENOUS | Status: DC

## 2012-07-15 MED ORDER — MAGIC MOUTHWASH
5.0000 mL | Freq: Three times a day (TID) | ORAL | Status: DC
  Administered 2012-07-15 (×2): 5 mL via ORAL
  Filled 2012-07-15 (×5): qty 5

## 2012-07-15 MED ORDER — MORPHINE SULFATE 2 MG/ML IJ SOLN: 2.0000 mg | INTRAMUSCULAR | Status: DC | PRN

## 2012-07-21 NOTE — Discharge Summary (Signed)
Patient ID: Barbara Montoya,  MRN: [id number], DOB/AGE: 11-Oct-1945 66 y.o.  Admit date: 07/22/2012 Discharge date: 07/21/2012  Primary Care Provider: Lyndon Code Primary Cardiologist: Judie Petit. Kirke Corin, MD  Discharge Diagnoses Principal Problem:   Myocardial infarction Active Problems:   Hypertension   Diabetes mellitus   CKD (chronic kidney disease), stage V   Acute respiratory failure   Acute pulmonary edema   Altered mental status   Hypotension   Cardiomyopathy, ischemic   Cardiogenic shock  Allergies No Known Allergies  Procedures  Cardiac Catheterization 07/22/2012  Hemodynamics: AO:  117/64   mmHg LV:  116/14    mmHg LVEDP: 18  mmHg  Coronary angiography: Coronary dominance: Right     Left Main:  Heavily calcified with 30% distal stenosis.  Left Anterior Descending (LAD):  Heavily calcified with diffuse 30% disease proximally. There is a 95% tubular stenosis in the midsegment which is likely the culprit for recent myocardial infarction. The rest of the LAD has minor irregularities.  Circumflex (LCx):  Medium in size and mildly calcified. There is 50% diffuse disease proximally. The OM branches are relatively small in size.  Ramus Intermedius:  Large in size with 90% hazy proximal stenosis.  Right Coronary Artery: This was not imaged. This was admitted yesterday during cardiac catheterization at [facility]. It showed diffuse disease in the proximal and midsegment with an occluded right PDA which gets collaterals from the left system.   Left ventriculography: Was deferred.  Final Conclusions:   1. Significant three-vessel coronary artery disease with no significant change in coronary anatomy since yesterday. The LAD is patent. 2. Mildly elevated left ventricular end-diastolic pressure.  Recommendations:  Evaluate for CABG once the patient is stable from a respiratory and renal status.  Lorine Bears MD, [facility] _____________   2D Echocardiogram Jul 22, 2012  Study  Conclusions  - Left ventricle: Septal apical inferior and inferolateral   hypokinesis The cavity size was severely dilated. Wall   thickness was normal. The estimated ejection fraction was   25%. - Mitral valve: Restrictive mitral inflows Mild   regurgitation. - Atrial septum: No defect or patent foramen ovale was   identified. - Pericardium, extracardiac: A trivial pericardial effusion   was identified. _____________   2D Echocardiogram 07/14/2012  Study Conclusions  - Left ventricle: No VSD appreciated Prominant apical   trabeculations cannot r/o thrombus Septal apical akinesis   The cavity size was severely dilated. Wall thickness was   increased in a pattern of mild LVH. The estimated ejection   fraction was 25%. Diffuse hypokinesis. - Aortic valve: Mild regurgitation. - Mitral valve: Mild regurgitation. - Atrial septum: No defect or patent foramen ovale was   identified. - Tricuspid valve: Mild-moderate regurgitation. - Pulmonary arteries: PA peak pressure: 37mm Hg (S). - Pericardium, extracardiac: Small pericardial effusion and   likely larger left pleural effusion _____________   History of Present Illness  67 year old female with known history of hypertension, type 2 diabetes, history of CVA, anemia of chronic kidney disease and stage V chronic kidney disease with a GFR of 10. She presented to [facility] on 6/23 with left arm and shoulder discomfort radiating to her throat that started several days prior to admission.  Troponin was found to be 23.  Echo showed EF of 40-45% with anterior wall HK.  Cath was performed @ [facility] on 6/24 revealing 3 vessel CAD with a 99% stenosis in the mid LAD.  Post-cath, she became hypoxic during dialysis and subsequently required intubation.  She was stabilized and  transferred to [facility] for surgical evaluation.   Hospital Course  Following admission to [facility], pt remained intubated and sedated.  She was seen by thoracic surgery and it was not  immediately clear if she would be a suitable candidate for bypass surgery given her acute respiratory decompensation.  She was later felt to be a poor candidate.  Echocardiogram was performed and showed an acute reduction in EF to 25%, down form what was seen @ [facility].  She was taken back to the cath lab on 6/25 where angiography showed stable anatomy.  Post-cath, she developed hypotension and was placed on levophed. This was subsequently transitioned to dobutamine secondary to cardiogenic shock.  On dobutamine, she became tachycardic on 6/27 and this was d/c'd in favor of neosynephrine resulting in worsened output.  Milrinone was added along with resumption of levophed.  Pt was extubated on 6/27 however developed recurrent respiratory failure on 6/29 requiring bipap.  This was able to be weaned over the subsequent days though she continued to require vasopressor support for hypotension and low output in setting of cardiogenic and possibly septic shock.  It was felt that she would be a candidate for PCI of the LAD once her respiratory status and hemodynamics stabilized.  She received CVVHD throughout admission as guided by nephrology.  Due to ongoing shock, a Swan-Ganz catheter was placed on 7/3 revealing low cardiac output but acceptable filling pressures.  A new murmur was noted on 7/4 and f/u echo showed persistently low EF of 25% w/o evidence of VSD.  Pt became hypothermic and somnolent overnight on 7/4->7/5.  After lengthy discussion with family, decision was made to provide comfort measures.  Patient passed on 2022/07/25 @ 1:21 AM.    Filed Weights   07/13/12 0449 07/14/12 0500 07-24-2012 0456  Weight: 178 lb 9.2 oz (81 kg) 180 lb 8.9 oz (81.9 kg) 180 lb 1.9 oz (81.7 kg)   Labs  CBC Lab Results  Component Value Date   WBC 34.0* Jul 24, 2012   HGB 8.5* Jul 24, 2012   HCT 26.1* 07/24/2012   MCV 74.6* 2012/07/24   PLT 169 2012-07-24   Basic Metabolic Panel Lab Results  Component Value Date   CREATININE 1.29*  07-24-2012   BUN 12 2012-07-24   NA 137 07-24-2012   K 5.1 July 24, 2012   CL 98 24-Jul-2012   CO2 16* 24-Jul-2012   Liver Function Tests Lab Results  Component Value Date   ALT 44* Jul 24, 2012   AST 57* 07-24-2012   ALKPHOS 94 07/24/12   BILITOT 0.6 07/24/2012   Cardiac Enzymes Lab Results  Component Value Date   CKTOTAL 881* 07/08/2012   CKMB 76.2* 07/08/2012   TROPONINI 17.43* 07/08/2012   Thyroid Function Tests Lab Results  Component Value Date   TSH 4.496 07/17/2012   Duration of Discharge Encounter   Greater than 30 minutes including physician time.  Signed, Nicolasa Ducking NP 07/21/2012, 8:02 PM   Patient seen and examined independently. Gilford Raid, NP note reviewed carefully - agree with his assessment and plan. I have edited the note based on my findings. See progress notes for further details, if needed.   Daniel Bensimhon,MD 1:16 AM

## 2012-08-11 NOTE — Progress Notes (Signed)
Chaplain responded to request from RN to support family of pt who was actively dying. Pt's daughters were at bedside and pt's mother and brother were at the door. Provided emotional and spiritual support for pt's mother and brother during pt's last minutes. After pt passed chaplain continued to provided support to pt's mother who was very sad. Also offered words of comfort to pt's daughters who were very appreciative of my presence. Family grieved appropriately and were very support of one another. Chaplain moved with family to 2900 waiting area where family made calls and continued to comfort one another. Chaplain continued to provide empathic listening and words of comfort. Pt's daughter-in-law asked me to lead family in prayer. We gathered in a circle and had prayer together. Family provided RN with funeral home information. As family departed I accompanied them to hallway leading to main entrance.

## 2012-08-11 NOTE — Progress Notes (Signed)
eLink Physician-Brief Progress Note Patient Name: Barbara Montoya DOB: 1945/09/01 MRN: [id number]  Date of Service  08/09/2012   HPI/Events of Note  C/O of sore mouth with no relief with current interventions  eICU Interventions  Plan: MMW 5 cc q8 hours   Intervention Category Minor Interventions: Routine modifications to care plan (e.g. PRN medications for pain, fever)  DETERDING,ELIZABETH 07/12/2012, 12:37 AM

## 2012-08-11 NOTE — Progress Notes (Signed)
Pt pronounced dead by Melodye Ped and Consuela Mimes at [phone] by absence of heart sounds for 1 hour. Family in the room. CDS contacted, Critical care made aware. Family to call back with funeral home.

## 2012-08-11 NOTE — Progress Notes (Signed)
PULMONARY  / CRITICAL CARE MEDICINE  Name: Barbara Montoya MRN: [id number] DOB: [birth year]    ADMISSION DATE:  07/09/2012 CONSULTATION DATE:  06/18/2012  REFERRING MD :  Barbara Montoya PRIMARY SERVICE: Cardiology  CHIEF COMPLAINT:  NSTEMI  BRIEF PATIENT DESCRIPTION: 67 year old female admitted 6/25 from [place] following NSTEMI, c/b respiratory failure and AKI. In cardiogenic shock, new dialysis requirement.   SIGNIFICANT EVENTS / STUDIES:  6/23 ECHO >>> EF 40% 6/24 Cath >>> Mid LAD 99% stenosis, Ramus intermedius 90% stenosis, Proximal RCA 70% stenosis, Mid RCA 50% stenosis, RPDA 100% stenosis, RPLS 90% stenosis .................................................................................................................................. 6/25 Admit to [facility] 6/25 TTE >> dilated LV, EF 25%, focal hypokinesis 6/29 Increased reqt for vasopressors, on CRRT, on BiPAP for refractory hypoxemia, CXR shows new LLL consolidation and effusion  LINES / TUBES: ETT 6/25 >>>6/28 L IJ TLC ([facility]) >>> R [place] HD cath >>> R IJ cordis 7/3 >>> R IJ PAC 7/3 >>>  CULTURES: BC ([facility])>>> 6/25 UC>>> negative  ANTIBIOTICS: Vanc 6/29 >> Zosyn 6/28 >>  SUBJECTIVE / Interval events: requiring BIPAP  Pressor demands increasing CRRT  Lethargic but follows commands  VITAL SIGNS: Temp:  [95 F (35 C)-97.7 F (36.5 C)] 95 F (35 C) (07/05 0837) Pulse Rate:  [108-118] 115 (07/05 0710) Resp:  [9-35] 29 (07/05 0832) BP: (71-103)/(40-75) 96/75 mmHg (07/05 0710) SpO2:  [94 %-100 %] 95 % (07/05 0832) FiO2 (%):  [50 %-100 %] 100 % (07/05 0700) Weight:  [81.7 kg (180 lb 1.9 oz)] 81.7 kg (180 lb 1.9 oz) (07/05 0456)  HEMODYNAMICS: PAP: (21-48)/(9-34) 24/9 mmHg CVP:  [2 mmHg-16 mmHg] 6 mmHg PCWP:  [21 mmHg-23 mmHg] 21 mmHg CO:  [3.4 L/min-3.9 L/min] 3.9 L/min CI:  [1.8 L/min/m2-2 L/min/m2] 2 L/min/m2  VENTILATOR SETTINGS: Vent Mode:  [-]  FiO2 (%):  [50 %-100 %] 100 %  INTAKE / OUTPUT: Intake/Output     07/04  0701 - 07/05 0700 07/05 0701 - 07/06 0700   P.O. 170    I.V. (mL/kg) 1313.4 (16.1) 65 (0.8)   Other     IV Piggyback 670    Total Intake(mL/kg) 2153.4 (26.4) 65 (0.8)   Other 2351 137   Total Output 2351 137   Net -197.6 -72        Stool Occurrence 1 x     PHYSICAL EXAMINATION: General:  RASS 0, off BiPAP Neuro:  PERRL, following commands, MAEs HEENT:  No scleral icterus Cardiovascular:  RRR, holosystolic 3/6 murmur Lungs:  CTA bilateral anterior Abdomen:  Round, soft, non-tender. Active BS Musculoskeletal:  MAE well and equal Skin:  Pink, warm, dry and intact  LABS:  Recent Labs Lab 07/14/12 0410 07/14/12 1550 07/17/2012 0455  NA 136 136 137  K 4.6 4.1 5.1  CL 101 100 98  CO2 24 26 16*  BUN 12 8 12   CREATININE 1.33* 0.88 1.29*  GLUCOSE 137* 118* 119*    Recent Labs Lab 07/13/12 1238 07/14/12 0410 07/23/2012 0455  HGB 8.8* 8.6* 8.5*  HCT 27.6* 26.0* 26.1*  WBC 28.6* 31.6* 34.0*  PLT 208 213 169    CXR: 7/3 >> B effusions, mild edema pattern.  ASSESSMENT / Montoya:  PULMONARY A:  Acute respiratory failure - due to pulmonary edema LL Opacity - favor  infiltrate, w/ fever and rising wbc  Acute resp failure P:   - Continue Bipap as tolerated consider immediate comfort care - Pulm hygiene w/ xopenex as needed.   CARDIOVASCULAR A:  NSTEMI Shock, presumed cardiogenic; LVEF 25%, dilated  LV Coronary Artery Disease multivessel disease evident on cath, no change on repeat at [facility] 6/25 Hyperlipidemia chronic Hx HTN  7/2>>Increase pressors demand and increase HR after CRRT negative.  Likely volume depleted. 7/3>>>Worsening hypotension, now more volume repleted but remains very hypotensive with increased pressors demand, spoke with cardiology, recommend a swan to assess cardiac function.  P:  - Titrate Levophed/milrinone for SBP of 80, we are at max of levophed, no change in outcome to increase - Appreciate Dr vanTrigt's eval, not a candidate for CABG at this  time. Consider PTCI instead when more stable. - ASA, lipitor, heparin. - Volume reduction with CRRT - Per cardiology not a candidate for LVAD or heart transplant   RENAL A:   Stage V CKD baseline Cr 4.6 P:   - CRRT volume even at this point. - Avoid nephrotoxic medications. - Renal following.  GASTROINTESTINAL A:  No active issues P:   - Continue diet, hold if MS worsen - Protonix for SUP.  HEMATOLOGIC A:   Leukocytosis d/t stres/MI / infxn  Coagulopathy due to heparin infusion   Iron deficiency anemia chronic P:  - Continue ferrous sulfate. - Follow CBC. - Heparin, no need for SCD's while on gtt  INFECTIOUS A:   Leukocytosis Barbara Montoya /LLL infiltrate suspect new left sided PNA  P:   - Follow WBC - BC NTD - Will stop vanc/zosyn  ENDOCRINE A:   Type 2 DM with hyperglycemia   P:   - CBG q4h - SSI, consider dc if comfort is Montoya  NEUROLOGIC A:   Acute encephalopathy due to critical illness >improved  P:   - Monitor.  TODAY'S SUMMARY: Barbara Montoya presents following NSTEMI with significant CAD. EF 25% She also has baseline CKD stage V.  . On CRRT for volume even. Will continue to follow cardiology recommendations. Barbara Montoya is in respiratory failure this morning, does not want to continue with Bipap. Have called family to come to hospital for goals of care discussion, see further notes for outcome  Barbara Montoya [place] S-ACNP   Ccm time 30 min   I have fully examined this patient and agree with above findings.    And edited in full  Mcarthur Rossetti. Tyson Alias, MD, FACP Pgr: [phone] [facility]

## 2012-08-11 NOTE — Progress Notes (Signed)
Subjective: Interval History: States that she feels terrible today. Denies nausea  Objective: Vital signs in last 24 hours: Temp:  [96.1 F (35.6 C)-97.7 F (36.5 C)] 96.1 F (35.6 C) (07/05 0710) Pulse Rate:  [108-118] 115 (07/05 0710) Resp:  [9-35] 35 (07/05 0710) BP: (71-103)/(40-75) 96/75 mmHg (07/05 0710) SpO2:  [94 %-100 %] 99 % (07/05 0710) FiO2 (%):  [50 %-100 %] 100 % (07/05 0700) Weight:  [180 lb 1.9 oz (81.7 kg)] 180 lb 1.9 oz (81.7 kg) (07/05 0456) Weight change: -7.1 oz (-0.2 kg)  Intake/Output from previous day: 07/04 0701 - 07/05 0700 In: 2153.4 [P.O.:170; I.V.:1313.4; IV Piggyback:670] Out: 2351  Intake/Output this shift: Total I/O In: -  Out: 92 [Other:92]  General appearance: drowsy, cooperative and pale Resp: diminished breath sounds bilaterally but clear bilaterally, anterior lung fields Cardio: systolic murmur: holosystolic 3/6, blowing at apex and diastolic murmur: early diastolic 2/6, decrescendo at apex GI: hypoactive bs, soft, nontender, nondistended Extremities: Trace edema, weak pulses  Lab Results:  Recent Labs  07/14/12 0410 08-07-12 0455  WBC 31.6* 34.0*  HGB 8.6* 8.5*  HCT 26.0* 26.1*  PLT 213 169   BMET:   Recent Labs  07/14/12 1550 07-Aug-2012 0455  NA 136 137  K 4.1 5.1  CL 100 98  CO2 26 16*  GLUCOSE 118* 119*  BUN 8 12  CREATININE 0.88 1.29*  CALCIUM 8.9 8.9   No results found for this basename: PTH,  in the last 72 hours  Iron Studies: No results found for this basename: IRON, TIBC, TRANSFERRIN, FERRITIN,  in the last 72 hours  Studies/Results: Dg Chest Port 1 View  07/13/2012   *RADIOLOGY REPORT*  Clinical Data: Swan-Ganz catheter placement.  PORTABLE CHEST - 1 VIEW at 2:03 p.m.  Comparison: Chest x-ray dated 07/13/2012 at 10:30 am  Findings: Swan-Ganz catheter has been inserted and the tip is in the pulmonary artery to the right lower lobe. Two indwelling central venous catheters have tips in the superior vena cava,  unchanged.  Moderate bilateral pleural effusions, slightly more prominent. Slight decreased pulmonary vascular prominence.  Heart size is normal.  No acute osseous abnormality.  IMPRESSION: Swan-Ganz catheter tip in the right lower lobe.  Increasing effusions.  No pneumothorax. Pulmonary vascularity is slightly less prominent.   Original Report Authenticated By: Francene Boyers, M.D.   Dg Chest Port 1 View  07/13/2012   *RADIOLOGY REPORT*  Clinical Data: Pleural effusion.  CHF.  Respiratory difficulty.  PORTABLE CHEST - 1 VIEW  Comparison: 07/11/2012  Findings: Central vascular congestion and hazy perihilar lung base opacity is more prominent than on the prior exam.  More confluent opacity at the bases obscures hemidiaphragms.  The findings most suggestive of left greater right pleural effusions and pulmonary edema.  No pneumothorax.  The cardiac silhouette is normal in size.  No mediastinal or hilar masses.  Right internal jugular tunneled dual lumen central venous catheter and left internal jugular single lumen central venous catheter are stable, both with their tips in the superior vena cava.  IMPRESSION: Mild worsening lung aeration from prior exam with findings consistent with left greater right bilateral effusions and pulmonary edema.   Original Report Authenticated By: Amie Portland, M.D.    I have reviewed the patient's current medications.  Assessment/Plan: 1 CKD/AKI solute stable. Vol ok on CVVHD. Mild hyperkalemia. Acidotic, likey from poor organ perfusion.  2 Hypotension: Volume even on CVVHD. Increased pressor req.  3 Leukocytosis: ? Infectious --> ?PNA vs stress 4 CAD  Murmur definitely louder and dia component. Improved SVR 5 Hypothermia: Bear hugger in place 6 DM stable 7 Anemia stable 8 HPTH Vit D P CVVHD, even, increased pressor req, consider goals of care, outlook poor.    LOS: 10 days   Genelle Gather 08/10/2012,8:29 AM I have seen and examined this patient and agree with the  plan of care seen,examined and evaluated. Discussed with resident and nursing staff. Overall worsening with more dyspnea, ^ acid generation indic hypoperfusion of organs.  Outlook grim .  Destini Cambre L 07/12/2012, 10:24 AM

## 2012-08-11 NOTE — Significant Event (Signed)
Spoke w/ family at length. Discussed that she has failed all therapies to date and there are no further options.  Now full DNR. Will transition to comfort based care.  Plan: 1) Full DNR 2) add PRN morphine  3) stop all labs 4) stop CRRT 5) will not escalate pressors further  Have informed family we anticipate hours to minutes.   Anders Simmonds ACNP-BC [facility] Pulmonary/Critical Care Pager # [phone] OR # [phone] if no answer

## 2012-08-11 NOTE — Progress Notes (Signed)
Hypoglycemic Event  CBG:51  Treatment: D50 IV 25 mL  Symptoms: None  Follow-up CBG: Time:0819 CBG Result:90  Possible Reasons for Event: Inadequate meal intake  Comments/MD notified:none    Tammy Sours  Remember to initiate Hypoglycemia Order Set & complete

## 2012-08-11 NOTE — Progress Notes (Signed)
Patient ID: Barbara Montoya, female   DOB: 14-Aug-1945, 67 y.o.   MRN: [id number]    SUBJECTIVE:  Hypothermic overnight and Bear-hugger placed. No hypotensive with SBP 60s despite uptitration of levophed. Somnolent. Remains on CVVHD (keeping even)   Swan numbers this am  CVP 5 PAP 21/10 PCWP 20 CO/CI 3.7/1.9 SVR 1086   . antiseptic oral rinse  15 mL Mouth Rinse BID  . aspirin  325 mg Oral Daily  . atorvastatin  80 mg Oral q1800  . chlorhexidine  15 mL Mouth/Throat BID  . clopidogrel  75 mg Oral Q breakfast  . darbepoetin (ARANESP) injection - DIALYSIS  150 mcg Intravenous Q Mon-HD  . doxercalciferol  4 mcg Intravenous Q M,W,F-HD  . feeding supplement  1 Container Oral TID BM  . insulin aspart  2-6 Units Subcutaneous Q4H  . magic mouthwash  5 mL Oral Q8H  . multivitamin  1 tablet Oral Daily  . pantoprazole  40 mg Oral Daily  . piperacillin-tazobactam  3.375 g Intravenous Q6H  . sodium chloride  10-40 mL Intracatheter Q12H  . vancomycin  1,000 mg Intravenous Q24H  milrinone 0.375 norepi 28    Filed Vitals:   07/11/2012 0700 07/24/2012 0710 07/27/2012 0832 08/05/2012 0837  BP: 96/75 96/75    Pulse:  115    Temp: 96.1 F (35.6 C) 96.1 F (35.6 C) 95.2 F (35.1 C) 95 F (35 C)  TempSrc:      Resp: 28 35 29   Height:      Weight:      SpO2: 97% 99% 95%     Intake/Output Summary (Last 24 hours) at 07/23/2012 0919 Last data filed at 07/12/2012 0900  Gross per 24 hour  Intake 2006.38 ml  Output   2388 ml  Net -381.62 ml    LABS: Basic Metabolic Panel:  Recent Labs  40/98/11 0410 07/14/12 1550 07/26/2012 0455  NA 136 136 137  K 4.6 4.1 5.1  CL 101 100 98  CO2 24 26 16*  GLUCOSE 137* 118* 119*  BUN 12 8 12   CREATININE 1.33* 0.88 1.29*  CALCIUM 8.9 8.9 8.9  MG 2.6*  --  2.6*  PHOS 2.5 1.6* 4.7*   Liver Function Tests:  Recent Labs  07/13/12 0425  07/14/12 1550 07/25/2012 0455  AST 59*  --   --  57*  ALT 53*  --   --  44*  ALKPHOS 88  --   --  94  BILITOT 0.4   --   --  0.6  PROT 5.7*  --   --  5.8*  ALBUMIN 2.0*  < > 2.2* 2.2*  < > = values in this interval not displayed. No results found for this basename: LIPASE, AMYLASE,  in the last 72 hours CBC:  Recent Labs  07/14/12 0410 07/26/2012 0455  WBC 31.6* 34.0*  HGB 8.6* 8.5*  HCT 26.0* 26.1*  MCV 72.8* 74.6*  PLT 213 169   Cardiac Enzymes: No results found for this basename: CKTOTAL, CKMB, CKMBINDEX, TROPONINI,  in the last 72 hours BNP: No components found with this basename: POCBNP,  D-Dimer: No results found for this basename: DDIMER,  in the last 72 hours Hemoglobin A1C: No results found for this basename: HGBA1C,  in the last 72 hours Fasting Lipid Panel: No results found for this basename: CHOL, HDL, LDLCALC, TRIG, CHOLHDL, LDLDIRECT,  in the last 72 hours Thyroid Function Tests: No results found for this basename: TSH, T4TOTAL, FREET3, T3FREE, THYROIDAB,  in the last 72 hours Anemia Panel: No results found for this basename: VITAMINB12, FOLATE, FERRITIN, TIBC, IRON, RETICCTPCT,  in the last 72 hours  RADIOLOGY: Dg Chest Port 1 View  07/11/2012   *RADIOLOGY REPORT*  Clinical Data: Respiratory failure  PORTABLE CHEST - 1 VIEW  Comparison: 06/23/2012  Findings: The cardiac shadow is stable.  A left-sided jugular line and right-sided dialysis catheter are again seen and stable.  There are persistent changes in the left lung base.  This is likely a combination of infiltrate and underlying effusion.  No new focal abnormality is seen.  IMPRESSION: Stable appearance of the chest when compared with the previous exam.   Original Report Authenticated By: Alcide Clever, M.D.   Dg Chest Port 1 View  07/10/2012   *RADIOLOGY REPORT*  Clinical Data: Shortness of breath and respiratory failure.  PORTABLE CHEST - 1 VIEW  Comparison: 07/09/2012  Findings: Dialysis catheter tip is at the junction of the superior vena cava and right atrium.  Left jugular central line in the upper SVC region.  Persistent  densities at the left lung base could represent airspace disease and pleural fluid.  Heart size is stable.  No evidence for a large pneumothorax.  IMPRESSION: Persistent densities in the left lower chest.  Findings may represent a combination airspace disease and pleural fluid.  Support apparatuses as described.   Original Report Authenticated By: Richarda Overlie, M.D.   Dg Chest Port 1 View  07/09/2012   *RADIOLOGY REPORT*  Clinical Data: Shortness of breath  PORTABLE CHEST - 1 VIEW  Comparison: 1 day prior  Findings: Right IJ dialysis catheter unchanged.  Left internal jugular line terminates at mid SVC.  Apical lordotic patient positioning. Cardiomegaly accentuated by AP portable technique.  Development of a small left pleural effusion. No pneumothorax.  Left base air space disease.  IMPRESSION: Development of left base air space disease and adjacent pleural fluid.  This airspace disease could represent atelectasis or infection/aspiration.  Asymmetric alveolar pulmonary edema felt less likely.   Original Report Authenticated By: Jeronimo Greaves, M.D.   Dg Chest Port 1 View  07/08/2012   *RADIOLOGY REPORT*  Clinical Data: Infiltrates  PORTABLE CHEST - 1 VIEW  Comparison: 2012-07-19  Findings: Endotracheal and NG tubes removed.  Bilateral internal jugular central venous catheters stable.  Bibasilar hypoaeration is unchanged.  No pneumothorax.  IMPRESSION: Extubated.  Stable bibasilar atelectasis.   Original Report Authenticated By: Jolaine Click, M.D.   Dg Chest Port 1 View  July 19, 2012   *RADIOLOGY REPORT*  Clinical Data: Check ET tube position.Respiratory failure.  PORTABLE CHEST - 1 VIEW  Comparison: 07/06/2012  Findings: Support devices are unchanged.  Bibasilar atelectasis or infiltrates, similar to prior study.  Improving aeration in the left upper lobe.  Suspect trace effusions.  Heart is normal size.  IMPRESSION: Improving aeration in the left upper lobe.  Bibasilar atelectasis, trace effusions.   Original  Report Authenticated By: Charlett Nose, M.D.   Dg Chest Port 1 View  07/06/2012   *RADIOLOGY REPORT*  Clinical Data: Intubation, central line placement, hypertension, diabetes  PORTABLE CHEST - 1 VIEW  Comparison: None.  Findings: Endotracheal tube 6 cm above the carina.  Right IJ dialysis catheter tips in the SVC.  Left IJ central line tip in the mid SVC as well.  Normal heart size and vascularity.  Ill-defined increased opacity throughout the left central lung could represent developing airspace disease versus asymmetric edema.  Right lung relatively clear.  No effusion or pneumothorax.  NG tube extends below the hemidiaphragms the tip not visualized.  IMPRESSION: Support apparatus in good position.  No pneumothorax  Ill-defined increased opacity left central lung could represent developing airspace process or asymmetric edema   Original Report Authenticated By: Judie Petit. Miles Costain, M.D.    PHYSICAL EXAM General: Somnolent hypothermi Neck: Swan on R. TLC on left. R subclavian dialysis cath Lungs: Clear to auscultation bilaterally with normal respiratory effort. CV: Nondisplaced PMI.  Heart mildly tachy, regular S1/S2, no S3/S4, 2/6 SEM at RSB  Abdomen: Soft, nontender, no hepatosplenomegaly, no distention.  Neurologic: Alert and oriented x 3.  Psych:Depressed affect. Extremities: No clubbing or cyanosis.   TELEMETRY: Reviewed telemetry pt in sinus tachycardia  ASSESSMENT: 67 yo with history of CKD and CAD presented initially to [facility] with MI.  Now in cardiogenic shock with AKI on CRRT.  She additionally has a LLL PNA.  1. Cardiogenic shock: EF 25%   2. CAD: s/p MI  3VD but not CABG candidate at this time.  Possible high risk PCI but need to stabilize first.  Continue ASA, heparin, Plavix, statin.  3. Renal: AKI on CKD, now on CRRT per renal. 4. PNA: LLL.  Vanco/zosyn.  As above, concerned for possible septic shock component.  5. New murmur  PLAN/DISCUSSION:  She is actively dying from cardiogenic  shock. Given low CVP will give fluid bolus for now but needs to be made comfort care.  I spoke with patient but she is too somnolent to make decisions. I spoke with CCM and they are addressing with family. I spoke to them at length yesterday that I felt that we were out of options.   The patient is critically ill with multiple organ systems failure and requires high complexity decision making for assessment and support, frequent evaluation and titration of therapies, application of advanced monitoring technologies and extensive interpretation of multiple databases.   Critical Care Time devoted to patient care services described in this note is 35 Minutes.  Reuel Boom Bensimhon 07/27/2012 9:19 AM

## 2012-08-11 NOTE — Progress Notes (Signed)
ANTICOAGULATION CONSULT NOTE - Follow Up Consult  Pharmacy Consult for Heparin Indication: NSTEMI, 3VCAD, awaiting PCI  No Known Allergies  Patient Measurements: Height: 5\' 6"  (167.6 cm) Weight: 180 lb 1.9 oz (81.7 kg) IBW/kg (Calculated) : 59.3 Heparin Dosing Weight: 76 kg  Vital Signs: Temp: 95 F (35 C) (07/05 0900) Temp src: Core (Comment) (07/05 0324) BP: 50/21 mmHg (07/05 0900) Pulse Rate: 115 (07/05 0710)  Labs:  Recent Labs  07/13/12 1238  07/14/12 0410 07/14/12 1550 07/14/12 2240 07-25-2012 0455  HGB 8.8*  --  8.6*  --   --  8.5*  HCT 27.6*  --  26.0*  --   --  26.1*  PLT 208  --  213  --   --  169  HEPARINUNFRC  --   < > 0.71*  --  0.82* 0.70  CREATININE  --   < > 1.33* 0.88  --  1.29*  < > = values in this interval not displayed.  Estimated Creatinine Clearance: 45.6 ml/min (by C-G formula based on Cr of 1.29).   Assessment: 37 YOF admitted with NSTEMI and found via cath to have severe 3vCAD -- not a CABG candidate, so awaiting PCI when stable. Heparin level this morning is therapeutic (HL 0.7, goal of 0.3-0.7). Hgb/Hct low but stable, plts slight drop -- will monitor. No overt s/sx of bleeding noted.  Goal of Therapy:  Heparin level 0.3-0.7 units/ml Monitor platelets by anticoagulation protocol: Yes   Plan:  1. Continue heparin at current rate of 800 units/hr (8 ml/hr) 2. Will continue to monitor for any signs/symptoms of bleeding and will follow up with heparin level in 8 hours to confirm therapeutic  Georgina Pillion, PharmD, BCPS Clinical Pharmacist Pager: [phone] 07/25/12 9:50 AM

## 2012-08-11 DEATH — deceased

## 2012-09-30 IMAGING — CR DG CHEST 1V PORT
1 series · 1 of 1 positions shown · non-contrast
Comparison: none

REASON FOR EXAM: recent pna still with cough
COMMENTS:

PROCEDURE:     DXR - DXR PORTABLE CHEST SINGLE VIEW  - July 18, 2010 [DATE]
RESULT:     Comparison: 07/13/2010

[view not recorded]
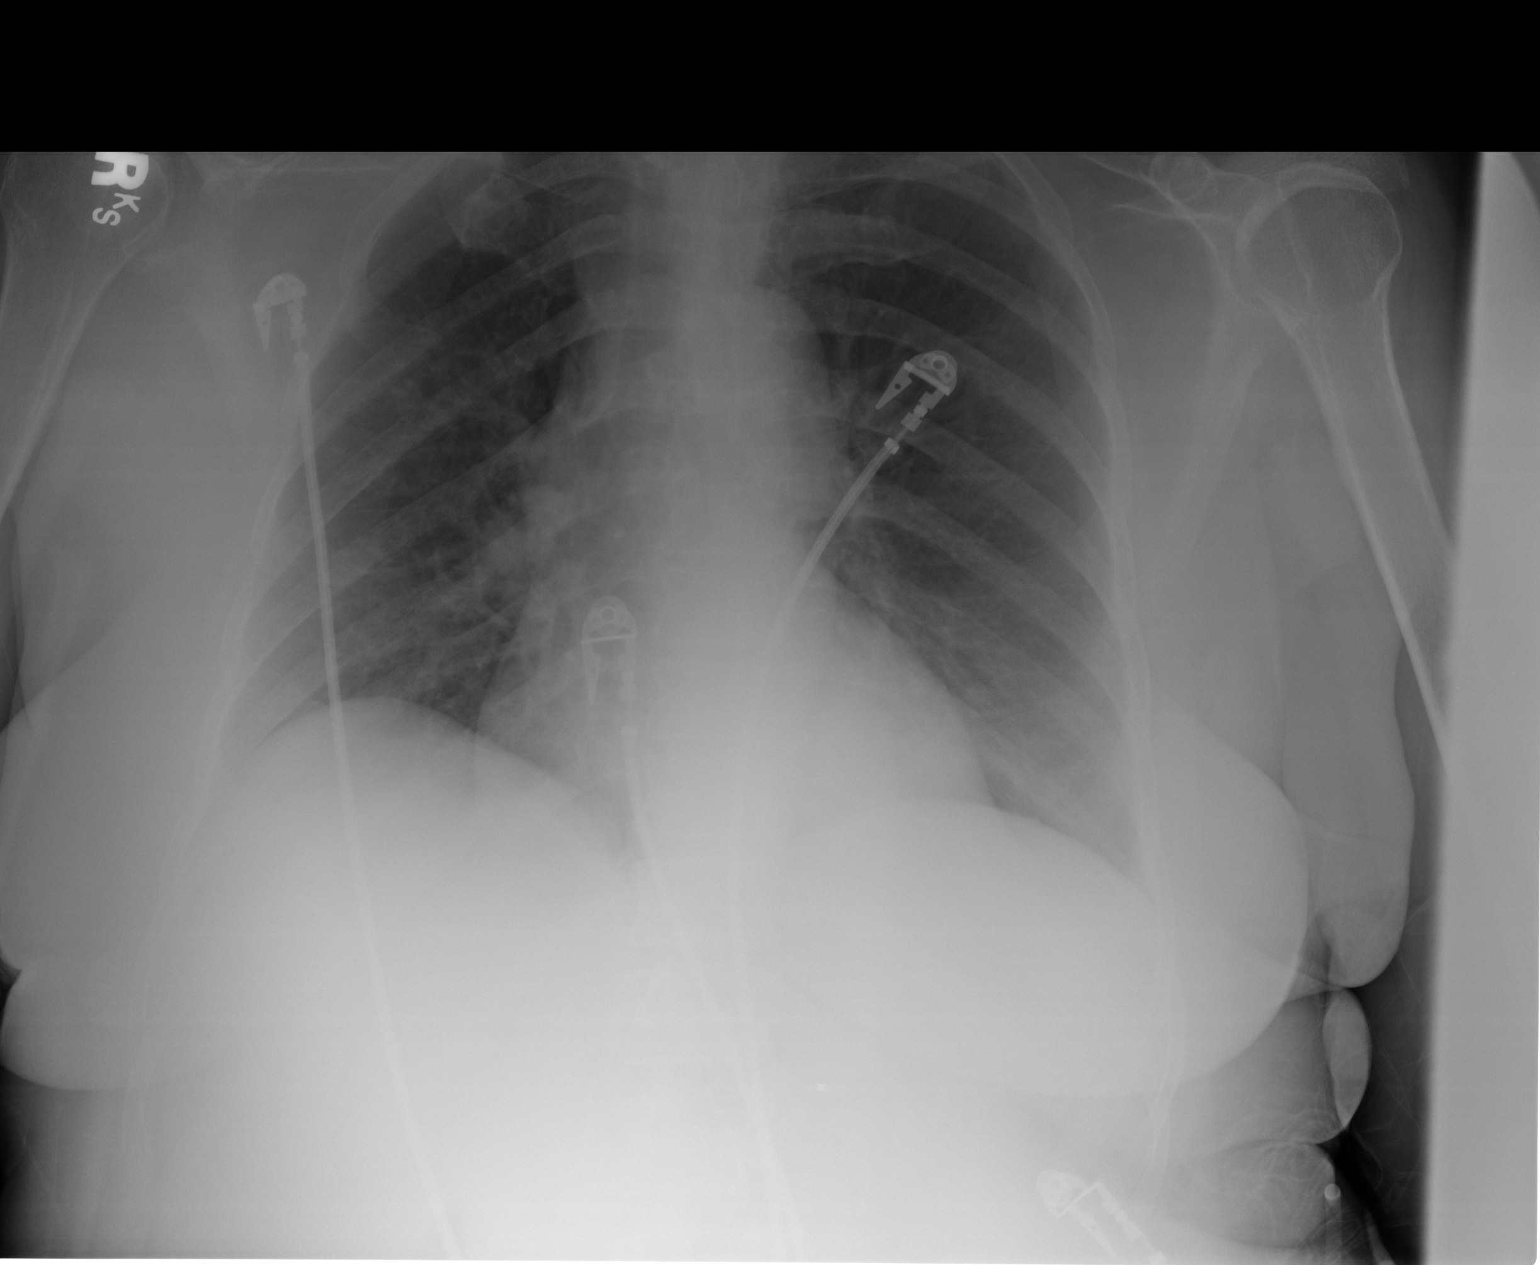

[1 of 1 positions shown; findings below may reference images not displayed]

FINDINGS: Single portable AP chest radiograph is provided. There is persistent right
lower lobe hazy airspace disease. There is no pleural effusion or
pneumothorax. Normal cardiomediastinal silhouette. The osseous structures
are unremarkable.
IMPRESSION: Persistent right lower lobe hazy airspace disease which may represent
atelectasis versus infection. Followup PA and lateral chest regrets are
recommended to document complete resolution following adequate medical
therapy.

## 2014-05-03 NOTE — H&P (Signed)
PATIENT NAME:  Barbara Montoya, Barbara Montoya MR#:  [id number] DATE OF BIRTH:  1945/12/27  DATE OF ADMISSION:  07/03/2012  ADMITTING PHYSICIAN:  Enid Baasadhika Kaliq Lege, MD  PRIMARY CARE PHYSICIAN:  Lyndon CodeFozia M. Khan, MD  PRIMARY NEPHROLOGIST:  Mosetta PigeonHarmeet Singh, MD  CHIEF COMPLAINT:  Left arm pain and dyspnea on exertion.   HISTORY OF PRESENT ILLNESS:  Ms. Barbara Montoya is a 69 year old pleasant female with a past medical history significant for hypertension, diabetes mellitus, history of CVA in the past, anemia of chronic kidney disease, CKD stage V with last creatinine at Dr. Doristine ChurchSingh's office of 4.72 with GFR of 10 at the time and hyperlipidemia brought in from home secondary to left arm pain going on for almost 2 days now. The patient denies any prior cardiac history. She denies any chest pain, but she has some dyspnea on exertion and also nausea this morning with worsening of the left arm pain radiating to her jaw so she presented to the ED. She was found to have an elevated troponin here as high as 23 and is being admitted for non-STEMI. EKG is showing changes of left bundle branch block which she already had on her prior EKG from 2012, but she has significant ST depressions noted on V5 and V6 which are new.   PAST MEDICAL HISTORY:   1.  Hypertension.  2.  Type 2 diabetes mellitus.  3.  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  4.  Anemia of chronic disease.  5.  CKD stage V. Last creatinine was 4.72 from June 13, 2012 and GFR of 10 at the time as an outpatient.  6.  Hyperlipidemia.   PAST SURGICAL HISTORY:  None.   ALLERGIES TO MEDICATIONS:  No known drug allergies.   CURRENT HOME MEDICATIONS:   1.  Amlodipine 10 mg p.o. daily.  2.  Aspirin 325 mg p.o. daily.  3.  Atorvastatin 40 mg p.o. daily.  4.  Coreg 25 mg p.o. Montoya.i.d.  5.  Ferrous sulfate 325 mg p.o. daily.  6.  Glimepiride 4 mg p.o. daily.  7.  Lasix 40 mg p.o. daily.  8.  Nephro-Vite Montoya complex vitamin 1 tablet p.o. daily.  9.  Sodium  bicarbonate 650 mg p.o. Montoya.i.d.  10.  Zemplar 1 mcg capsule p.o. daily.  11.  Lantus 12 units subcutaneous at bedtime.   SOCIAL HISTORY:  Lives at home with her mother. The patient is still active and functional and walks independently at baseline. No history of any smoking, alcohol or drug use.   FAMILY HISTORY:  Mom is currently alive and has only hypertension. Father deceased in 6260s from a massive heart attack.   REVIEW OF SYSTEMS:  CONSTITUTIONAL: No fever, fatigue or weakness.  EYES: No blurred vision, double vision, inflammation, glaucoma or cataracts.  ENT: No tinnitus, ear pain, hearing loss, epistaxis or discharge.  RESPIRATORY: No cough, hemoptysis, COPD or wheezing.  CARDIOVASCULAR: No chest pain, orthopnea or edema. Positive for dyspnea on exertion. No palpitations or syncope.  GASTROINTESTINAL: Positive for nausea. No vomiting, abdominal pain, diarrhea, hematemesis or melena.  GENITOURINARY: No dysuria, hematuria, renal calculus, frequency or incontinence.  ENDOCRINE: No polyuria, nocturia, thyroid problems, heat or cold intolerance.  HEMATOLOGY: No anemia, easy bruising or bleeding.  SKIN: No acne, rash or lesions.  MUSCULOSKELETAL: No neck, back, shoulder pain, arthritis or gout.  NEUROLOGIC: History of CVA with residual mild left arm weakness, but no TIA, seizures, numbness or vertigo.  PSYCHOLOGICAL: No anxiety, insomnia, depression.  PHYSICAL EXAMINATION: VITAL SIGNS: Temperature 98.4 degrees Fahrenheit, pulse 70, respirations 18, blood pressure 130/60, pulse oximetry 97% on room air.  GENERAL: Well-built, well-nourished female lying in bed not in any acute distress.  HEENT: Normocephalic, atraumatic. Pupils equal, round, reacting to light. Anicteric sclerae. Extraocular movements are intact. Oropharynx is clear without any lesions, discharge, erythema, mass or exudates.  NECK: Supple. No thyromegaly, JVD or carotid bruits. No lymphadenopathy.  LUNGS: Clear to  auscultation bilaterally. No wheeze or crackles. No use of accessory muscles for breathing.  CARDIOVASCULAR: S1, S2 regular rate and rhythm. She has a 3/6 systolic murmur. No rubs or gallops.  ABDOMEN: Soft, nontender, nondistended. No hepatosplenomegaly. Normal bowel sounds.  EXTREMITIES: No pedal edema. No clubbing or cyanosis, 2+ dorsalis pedis pulses palpable bilaterally.  SKIN: No acne, rash or lesions.  LYMPHATICS: No cervical or inguinal lymphadenopathy.  NEUROLOGIC: Cranial nerves II through XII are intact. Deep tendon reflexes are 2+ in both upper and lower extremities. Motor strength is equal in all 4 extremities on exam. Sensation is intact.  PSYCHOLOGICAL: The patient is awake, alert, oriented x 3.   LABORATORY DATA:   1.  WBC 16.1, hemoglobin 11.0, hematocrit 34.2, platelet count 206.  2.  Sodium 138, potassium 3.6, chloride 104, bicarbonate 23, BUN 58, creatinine 4.8, glucose 174, calcium 10.  3.  ALT 27, AST 112, alkaline phosphatase 86, total bilirubin 0.4, albumin of 3.2. Troponin is 23. Urinalysis is with 1+ leukocyte esterase, few WBCs and trace bacteria noted.  4.  Chest x-ray revealing patchy density in right lung base. Cardiac silhouette is normal. Lungs are otherwise clear.  5.  EKG is showing left bundle branch block with significant ST depressions noted in V4, V5 and V6 and left bundle branch block, heart rate of 70.   ASSESSMENT AND PLAN:  A 69 year old female with a past medical history significant for hypertension, diabetes, chronic kidney disease stage V who was in process of being set up for peritoneal dialysis as an outpatient brought in for left arm pain and found to have elevated troponin.  1.  Non-ST segment elevation myocardial infarction with ST depressions in the lateral leads on EKG and elevated first troponin. We will admit to telemetry. Discussed with Dr. Kirke Corin. Likely cardiac catheterization tomorrow morning. Started on intravenous heparin drip, aspirin,  nitroglycerin, Coreg and statin.  2.  Chronic kidney disease stage V. She will probably need to be started on dialysis this admission and will need a temporary hemodialysis catheter prior to being placed on any peritoneal dialysis as she still does not have any catheter placed. Family and patient notified and they are agreeable for the same and consult with nephrology and vascular for access placement.  3.  Hypertension. Continue home medications.  4.  Diabetes. Check hemoglobin A1c. Continue glimepiride, sliding scale insulin and Lantus.  5.  Leukocytosis- stress reaction from myocardial infarction; however, pneumonia cannot be ruled out based on x-ray. She has right lower lobe infiltrate. We will add Levaquin.  6.  Gastrointestinal and deep vein thrombosis prophylaxis. She is on Protonix and intravenous heparin drip.   CODE STATUS:  Full code.   TIME SPENT ON ADMISSION:  50 minutes.    ____________________________ Enid Baas, MD rk:si D: 07/03/2012 17:12:39 ET T: 07/03/2012 17:57:29 ET JOB#: [id number]  cc: Enid Baas, MD, <Dictator> Lyndon Code, MD Mosetta Pigeon, MD  Enid Baas MD ELECTRONICALLY SIGNED 07/03/2012 20:39

## 2014-05-03 NOTE — Discharge Summary (Signed)
PATIENT NAME:  Barbara Montoya, Barbara Montoya MR#:  [id number] DATE OF BIRTH:  27-Mar-1945  DATE OF ADMISSION:  07/03/2012  ADMITTING PHYSICIAN:  Dr. Enid Baas   DATE OF DISCHARGE:  07/10/2012  DISCHARGING PHYSICIAN:  Dr. Enid Baas  PRIMARY PHYSICIAN: Dr. Beverely Risen   CONSULTATIONS IN THE HOSPITAL:  1.  Cardiology consultation by Dr. Kirke Corin and Dr. Dossie Arbour.  2.  Nephrology consultation by Dr. Mady Haagensen.  3.  Vascular consultation by Dr. Gilda Crease.  4.  Pulmonary/Critical Care consultation by Dr. Belia Heman.   DISCHARGE DIAGNOSES: 1.  Acute respiratory failure secondary to acute cardiogenic pulmonary edema. The patient is intubated and on ventilator.  2.  Non-ST segment elevation myocardial infarction, status post cardiac cath, revealing severe 3- vessel disease requiring bypass graft surgery.  3.  End-stage renal disease, status post temporary dialysis catheter placement and initiation of dialysis this hospitalization.  4.  Hypertension.  5.  Diabetes mellitus.  6.  Right lower lobe pneumonia.   DISCHARGE MEDICATIONS: 1.  Heparin IV drip.  2.  Propofol for sedation.  3.  Aspirin 325 mg p.o. daily.  4.  Atorvastatin 40 mg p.o. daily.  5.  Ferrous sulfate 325 mg p.o. daily.  6.  Glimepiride 4 mg oral daily.  7.  Sliding scale insulin.  8.  Multivitamin 1 tablet p.o. daily.  9.  Nitroglycerin patch. 10.  Sodium bicarbonate 650 mg p.o. b.i.d.  11.  Protonix 40 mg p.o. q. morning.  12.  Lasix 40 mg IV push q. 8 hours.  13.  Combivent inhaler 8 puffs q. 4 hours while on vent.  14.  Flovent 2 puffs q. 12 hours 220 mcg strength.  15.  Levaquin 500 mg orally q. 48 hours, started on 07/03/2012.  16.  Coreg 25 mg p.o. b.i.d.  17.  Amlodipine 10 mg p.o. daily.   LABS AND IMAGING STUDIES:  ABG:  pH of 7.48, pCO2 of 26, pO2 of 43, bicarb 19.4, sats of 85% on 40% FiO2.   Sodium 138, potassium 3.5, chloride 104, bicarb 22, BUN 51, creatinine 4.95, glucose 106, GFR of 8.    WBC 19.4,  hemoglobin 10.4, hematocrit 32.4, platelet count 184.   Chest x-ray showing mild interstitial infiltrate secondary to pulmonary edema likely. Endotracheal tube is present.   Hepatitis B surface antigen is negative.   Echo Doppler showing decreased global LV systolic function, EF of 40% to 45%, severe hypokinesis of anterior, anteroseptal and apical region, impaired relaxation of LV diastolic filling, moderate mitral valve regurgitation, mild to moderate tricuspid regurgitation is present.   Cardiac catheterization revealing diffuse 3-vessel coronary artery disease, recommended coronary artery bypass graft surgery.   Blood cultures are negative on admission.  Intact parathyroid hormone is elevated at 358.   Urinalysis negative for any infection.   BRIEF HOSPITAL COURSE:  Barbara Montoya is a 69 year old female with past medical history significant for CKD stage 5, with last GFR of less than 10 as an outpatient and baseline creatinine of 4.7, who was in the process of being started on peritoneal dialysis; hypertension, diabetes, presented to the hospital secondary to left arm pain and heaviness. She was found to have elevated troponin and ST segment depressions on her lateral leads on the EKG, so was admitted to telemetry. Cardiology and Nephrology consultations have been requested because patient was going to need dialysis during this hospitalization, after the cardiac cath. She had dialysis catheter placement done on 07/04/2012, as well as a central line for poor access.  The same day, she had a cardiac catheterization done, which revealed severe 3-vessel disease and requiring bypass graft surgery. The patient was supposed to get dialysis the same evening and transfer to [facility] the next day for bypass graft surgery scheduling. However, 20 minutes into her first dialysis session, patient suddenly became significantly hypoxemic, said that she could not breathe, and was placed on 100% nonrebreather, in  spite of which her sats were only in the 60%. Chest x-ray at that time revealed diffuse interstitial edema, so patient was moved over to CCU, diuresed with Lasix, and her mental status improved overnight. She was still on high flow nasal cannula, with FiO2 of 84%. Her chest x-ray showed some improvement after 12 hours of diuresis, and patient had about 2 liters urine output over 12 hours, so she was restarted back on dialysis while in the CCU bedside, but again 15 minutes into dialysis, she had another similar episode when she became hypoxic, and got intubated. The patient is in a critical condition and is being transferred [facility] for bypass graft surgery, and probably will undergo dialysis over there.  1.  Acute respiratory failure, intubated on ventilator secondary to acute pulmonary edema while started on dialysis, happened twice. She is being diuresed with Lasix IV q. 8 hours. She got a dose of 120 mg IV Lasix prior to transfer. Her echocardiogram showed her EF was 40% to 45% with severe wall motion abnormalities.   2.  Non-ST segment elevation MI, status post cardiac cath, with severe 3-vessel disease. Requires bypass graft surgery, for which she is being transferred [facility]. She is currently on heparin drip and also medications, including aspirin, Coreg, nitro paste at this time, and she is also on statin.   3.  End-stage renal disease. GFR has been 8 to 10 range for the past few months. Was supposed to be started on peritoneal dialysis as an outpatient. However, now with cardiac catheterization and contrast receiving, she was attempted with hemodialysis twice in the hospital after temporary hemodialysis catheter was placed, but both times patient went into acute respiratory distress with acute pulmonary edema, so dialysis was stopped. Dialysis will likely need to be resumed or attempted again at [facility].   4. Hypertension. Her Norvasc and Coreg were continued, She had a  brief hypotension after intubation and sedation, at which time her blood pressure medications were held.   5.  Diabetes mellitus. She is on glimepiride and also on sliding scale insulin.   6.  Pneumonia. She has a right lower lobe infiltrate on admission, for which she is on Levaquin.   7. Code status is FULL CODE. Patient is critically ill at this time and high risk for cardiorespiratory arrest.   DISCHARGE CONDITION:  Critically ill.   DISCHARGE DISPOSITION: To [facility].   Time spent on discharge is 45 minutes.     ____________________________ Enid Baasadhika Stetson Pelaez, MD rk:mr D: Feb 22, 2012 14:26:54 ET T: Feb 22, 2012 18:56:28 ET JOB#: [id number]  cc: Enid Baasadhika Olamae Ferrara, MD, <Dictator> Enid BaasADHIKA Nirvan Laban MD ELECTRONICALLY SIGNED 07/27/2012 7:34

## 2014-05-03 NOTE — Consult Note (Signed)
PATIENT NAME:  Barbara Montoya, Barbara B MR#:  [id number] DATE OF BIRTH:  21-Apr-1945  DATE OF CONSULTATION:  07/03/2012  REQUESTING PHYSICIAN:  Enid Baasadhika Kalisetti, MD CONSULTING PHYSICIAN:  Muhammad A. Kirke CorinArida, MD  PRIMARY CARE PHYSICIAN:  Beverely RisenFozia Khan, MD  REASON FOR CONSULTATION:  Non-ST elevation myocardial infarction.   HISTORY OF PRESENT ILLNESS:  This is a 69 year old female with known history of hypertension, type 2 diabetes, history of CVA, anemia of chronic kidney disease and stage V chronic kidney disease with a GFR of 10. She presented with left arm and shoulder discomfort radiating to her throat which started on Friday. This has been intermittent and worsening every night. She denies any chest discomfort. She did notice increased shortness of breath and mild orthopnea. She denies any similar symptoms. EKG showed left bundle branch block which was present on a previous EKG in 2012. Troponin was found to be 23.   PAST MEDICAL HISTORY:   1.  Hypertension.  2.  Type 2 diabetes.  3.  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  4.  Anemia of chronic disease.  5.  Stage V chronic kidney disease.  6.  Hyperlipidemia.   ALLERGIES:  No known drug allergies.   HOME MEDICATIONS:   1.  Amlodipine 10 mg daily.  2.  Aspirin 325 mg once daily.  3.  Atorvastatin 40 mg daily.  4.  Coreg 25 mg twice daily.  5.  Ferrous sulfate 325 mg once daily.  6.  Glimepiride 4 mg once daily.  7.  Lasix 40 mg once daily.  8.  Multivitamin once a day.  9.  Sodium bicarbonate 650 mg twice daily.  10.  Zemplar 1 mcg once a day.  11.  Insulin Lantus.   SOCIAL HISTORY:  She lives at home with her mother. She denies any alcohol, smoking or recreational drug use.   FAMILY HISTORY:  Her father died of a myocardial infarction in his 6960s.   REVIEW OF SYSTEMS:  A 10-point review of systems was performed. It is negative other than what is mentioned in the HPI.   PHYSICAL EXAMINATION: GENERAL: The patient  appears to be at her stated age in no acute distress.  VITAL SIGNS: Temperature 99.6, pulse 75, respiratory rate 20, blood pressure 129/83 and oxygen saturation is 96% on room air.  HEENT: Normocephalic, atraumatic.  NECK: No JVD or carotid bruits.  RESPIRATORY: Normal respiratory effort with no use of accessory muscles. Auscultation reveals a few crackles at the base.  CARDIOVASCULAR: Normal PMI. Normal S1 and S2 with no gallops or murmurs.  ABDOMEN: Benign, nontender and nondistended.  EXTREMITIES: No clubbing, cyanosis or edema.  SKIN: Warm and dry with no rash.  PSYCHIATRIC: She is alert, oriented x 3 with normal mood and affect.   LABORATORY AND DIAGNOSTIC DATA:  Labs showed a creatinine of 4.8. Troponin is 23. White cell count is 16,000 and hemoglobin of 11. EKG showed left bundle branch block which is not new.   IMPRESSION:   1.  Non-ST elevation myocardial infarction with late presentation.  2.  Advanced chronic kidney disease predialysis.  3.  Hypertension.  4.  Type 2 diabetes.   RECOMMENDATIONS:  The patient presents with non-ST elevation myocardial infarction or late presentation ST elevation myocardial infarction with an underlying left bundle branch block which makes diagnosis of ST elevation difficult. Currently, she is not having any ischemic symptoms. She was already started on unfractionated heparin and aspirin. Her blood pressure is well controlled  on current medications. She is already on a statin. I recommend continuing current medications. The patient has advanced chronic kidney disease, but the plan was to initiate the dialysis in the near future. I discussed with her management options and recommend proceeding with cardiac catheterization and possible coronary intervention. I discussed the risks and benefits. Obviously, the main issue here is that her kidney function will likely worsen with contrast use which will likely push her to needing dialysis. We will obtain a  nephrology consultation. I will also obtain an echocardiogram in the morning to evaluate LV systolic function and try to minimize contrast use.    ____________________________ Chelsea Aus. Kirke Corin, MD maa:si D: 07/03/2012 19:17:22 ET T: 07/03/2012 21:00:46 ET JOB#: [id number]  cc: Muhammad A. Kirke Corin, MD, <Dictator> Enid Baas, MD Lyndon Code, MD  Jerolyn Center Argentina Donovan MD ELECTRONICALLY SIGNED 07/31/2012 9:13

## 2014-05-03 NOTE — Op Note (Signed)
PATIENT NAME:  TAYLEIGH, WETHERELL MR#:  [id number] DATE OF BIRTH:  1945/03/09  DATE OF PROCEDURE:  07/04/2012  PREOPERATIVE DIAGNOSES: 1.  Acute myocardial infarction.  2.  Acute on chronic renal insufficiency, now end-stage.  3.  Lack of appropriate intravenous access.   POSTOPERATIVE DIAGNOSES:  1.  Acute myocardial infarction.  2.  Acute on chronic renal insufficiency, now end-stage.  3.  Lack of appropriate intravenous access.   PROCEDURES PERFORMED: 1.  Insertion of right IJ cuffed tunneled dialysis catheter with ultrasound and fluoroscopic guidance.  2.  Insertion of a left IJ triple lumen catheter with ultrasound guidance.   SURGEON: Levora Dredge, MD   SEDATION: Versed 3 mg plus fentanyl 100 mcg administered IV. Continuous ECG, pulse oximetry and cardiopulmonary monitoring was performed throughout the entire procedure by the interventional radiology nurse. Total sedation time was 50 minutes.   ACCESS: Right IJ tunneled dialysis catheter, left IJ triple lumen catheter.   CONTRAST USED: None.   FLUOROSCOPY TIME: 3.3 minutes.   INDICATIONS: Ms. Kretz is a 69 year old woman who presented with signs and symptoms consistent with acute MI. She is, therefore, requiring cardiac catheterization. Her renal function has deteriorated given her episode of hypotension and will not likely improve, given the requirement for contrast. The risks and benefits for insertion of the cuffed tunneled dialysis catheter were reviewed, all questions answered. The patient agrees to proceed. She also is having significant problems with lack of appropriate IV access and given her acute myocardial infarction is going to have a triple-lumen placed so that she can continue her life-sustaining parenteral medications.   DESCRIPTION OF PROCEDURE: The patient is taken to special procedures and placed in the supine position. After adequate sedation is achieved, her right neck and chest wall is prepped and draped in  sterile fashion. Ultrasound is placed in a sterile sleeve. Ultrasound is utilized secondary to lack of appropriate landmarks and to avoid vascular injury. Jugular vein is identified. It is echolucent and compressible, indicating patency. Image is recorded for the permanent record. Under direct ultrasound visualization after 1% lidocaine is infiltrated in the soft tissues, a Micropuncture needle is inserted into the jugular vein, Microwire followed by a Microsheath, J-wire followed by 5-French sheath and 5-French pigtail catheter. Counterincision is made at the wire insertion site, a small pocket created with a hemostat, and then serial dilatation is performed over the wire. The dilator and peel-away sheath is inserted. The 19 cm tip-to-cuff Cannon catheter is then advanced through the peel-away sheath. The peel-away sheath is removed. The catheter tips are positioned under fluoroscopy. With fluoroscopic guidance, the catheter is then approximated to the chest wall.  An exit site is selected.  It is anesthetized with 1% lidocaine.  A small counterincision is made, and the tunneling device is passed from the exit site to counterincision. The catheter is then pulled subcutaneously, and under fluoroscopic guidance the tips are positioned at the atriocaval junction. The catheter is transected, the hub is connected. Both lumens aspirate and flush easily, and under fluoroscopy it has a smooth contour with proper tip positioning. Both lumens are then packed with 5000 units of heparin. The catheter is secured to the skin of the chest with 0 silk, and a 4-0 Monocryl is used to close the neck counterincision. Sterile dressing is applied.   The patient is then repositioned with the left neck exposed. The left neck is prepped and draped in sterile fashion. Ultrasound is placed in a sterile sleeve. The triple-lumen catheter is  opened on the back table; 1% lidocaine is infiltrated in the soft tissues of the neck after it has  been prepped and draped. The ultrasound identifies the jugular vein. It is echolucent and compressible indicating patency. Image is recorded. Under direct ultrasound visualization, a Micropuncture needle is inserted, Microwire followed by Microsheath.  A J-wire is then advanced. Dilator is passed over the wire, and the catheter is then advanced over the J-wire. The catheter is then aspirated and flushed, secured to the skin of the left neck with 2-0 silk, and a sterile dressing is applied.       The patient tolerated the procedure well, and there were no immediate complications. Sponge and needle counts were correct, and she is taken to the recovery area in stable condition.  ____________________________ Renford DillsGregory G. Theola Cuellar, MD ggs:cb D: 07/04/2012 18:58:26 ET T: 07/04/2012 23:12:24 ET JOB#: [id number]  cc: Renford DillsGregory G. Carole Deere, MD, <Dictator> Munsoor Lizabeth LeydenN. Lateef, MD Lyndon CodeFozia M. Khan, MD Chelsea AusMuhammad A. Kirke CorinArida, MD Renford DillsGREGORY G Countess Biebel MD ELECTRONICALLY SIGNED 07/18/2012 14:29

## 2014-09-17 IMAGING — XA IR VASCULAR PROCEDURE
2 series · 4 of 4 positions shown · non-contrast
Comparison: none

[Series 1: single · 3 of 3 slices shown (1 of 2)]
[im 1/3]
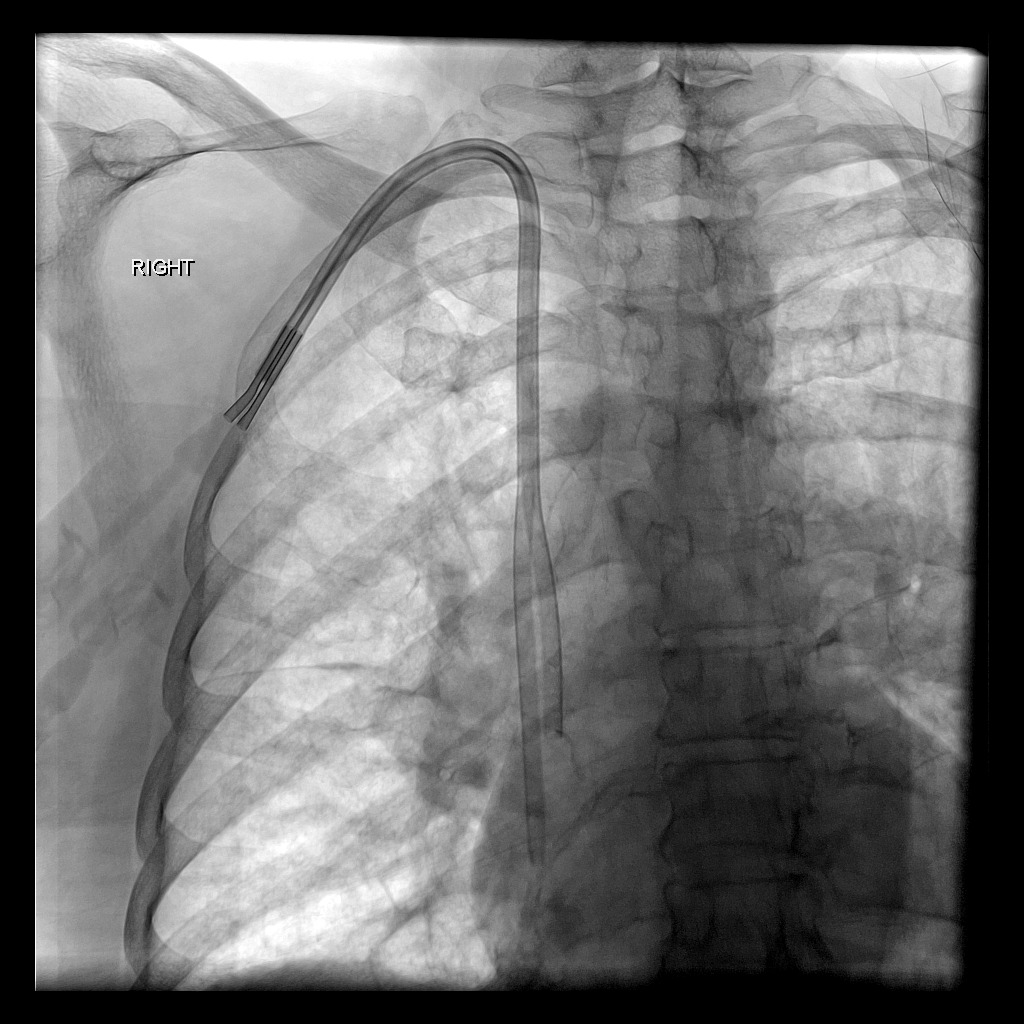
[im 2/3]
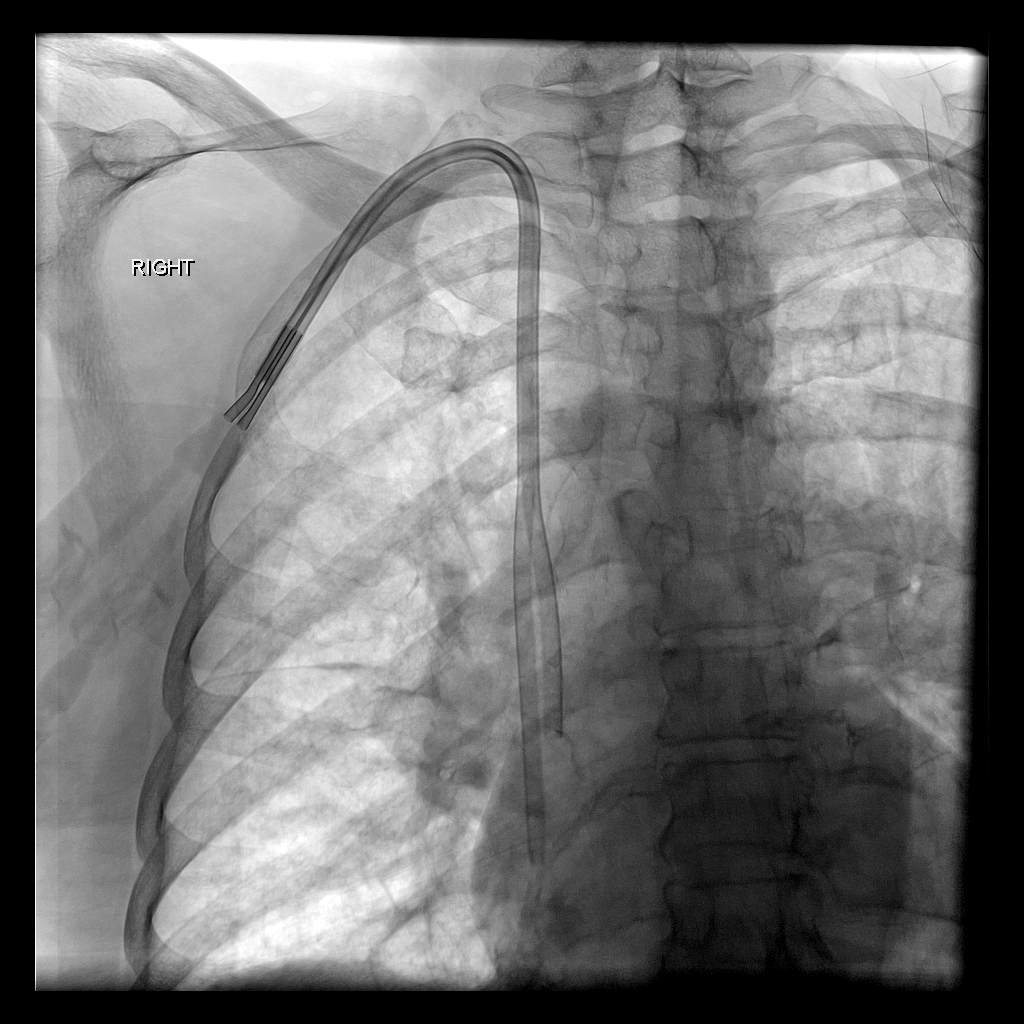
[im 3/3]
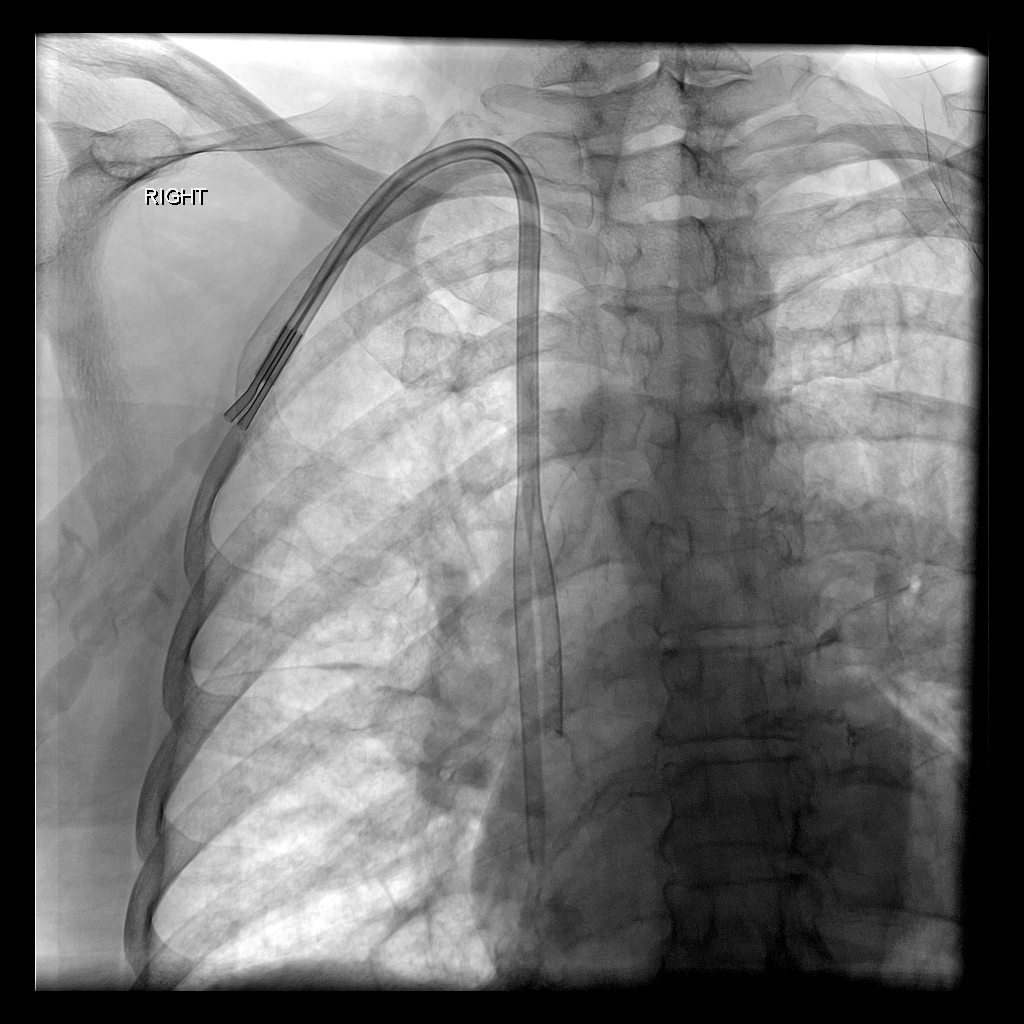

[Series 2: single · 1 of 1 slices shown (2 of 2)]
[im 1/1]
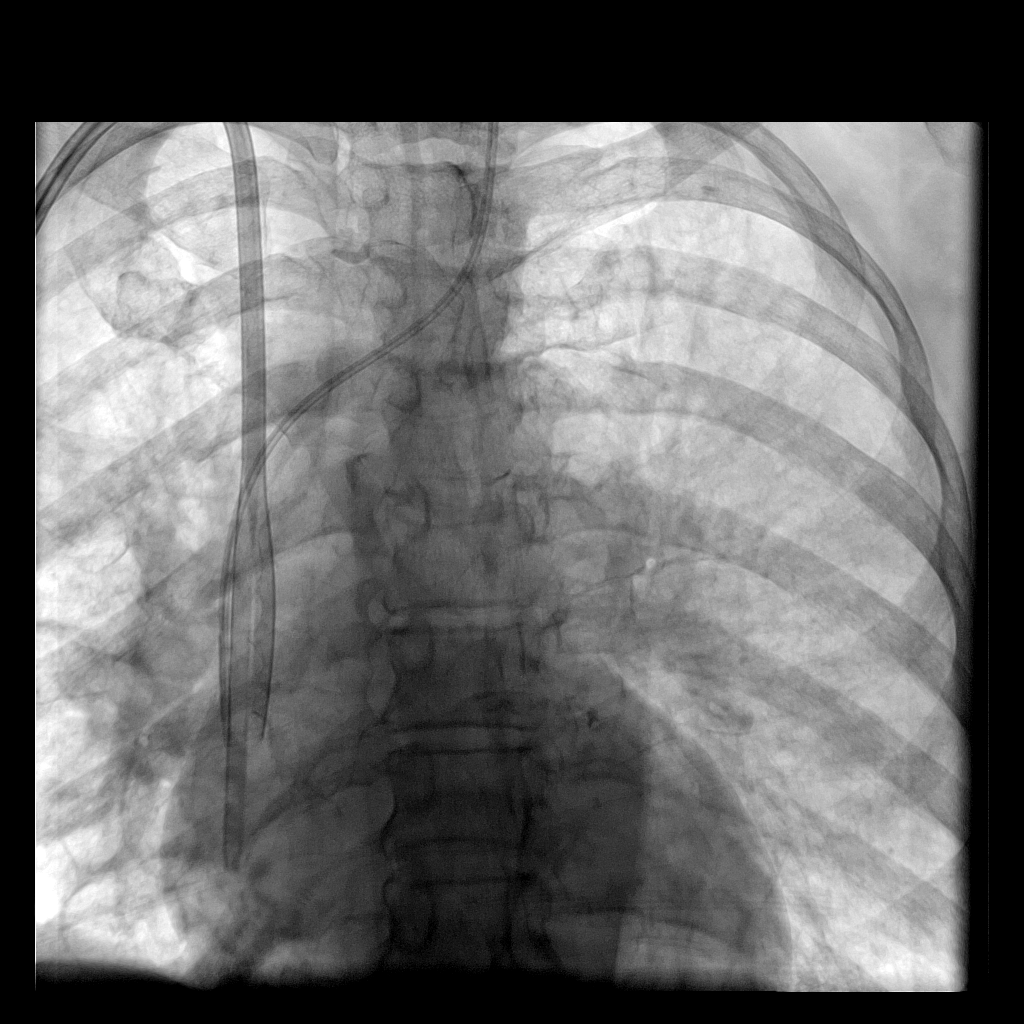

[4 of 4 positions shown; findings below may reference images not displayed]

IMAGES IMPORTED FROM THE [employer] WORKFLOW SYSTEM
NO DICTATION FOR STUDY

## 2014-09-18 IMAGING — CR DG CHEST 1V PORT
1 series · 1 of 1 positions shown · non-contrast
Comparison: none

REASON FOR EXAM: follow up pulm edema
COMMENTS:

PROCEDURE:     DXR - DXR PORTABLE CHEST SINGLE VIEW  - July 05, 2012  [DATE]
RESULT:     Comparison: 07/04/2012

[ap]
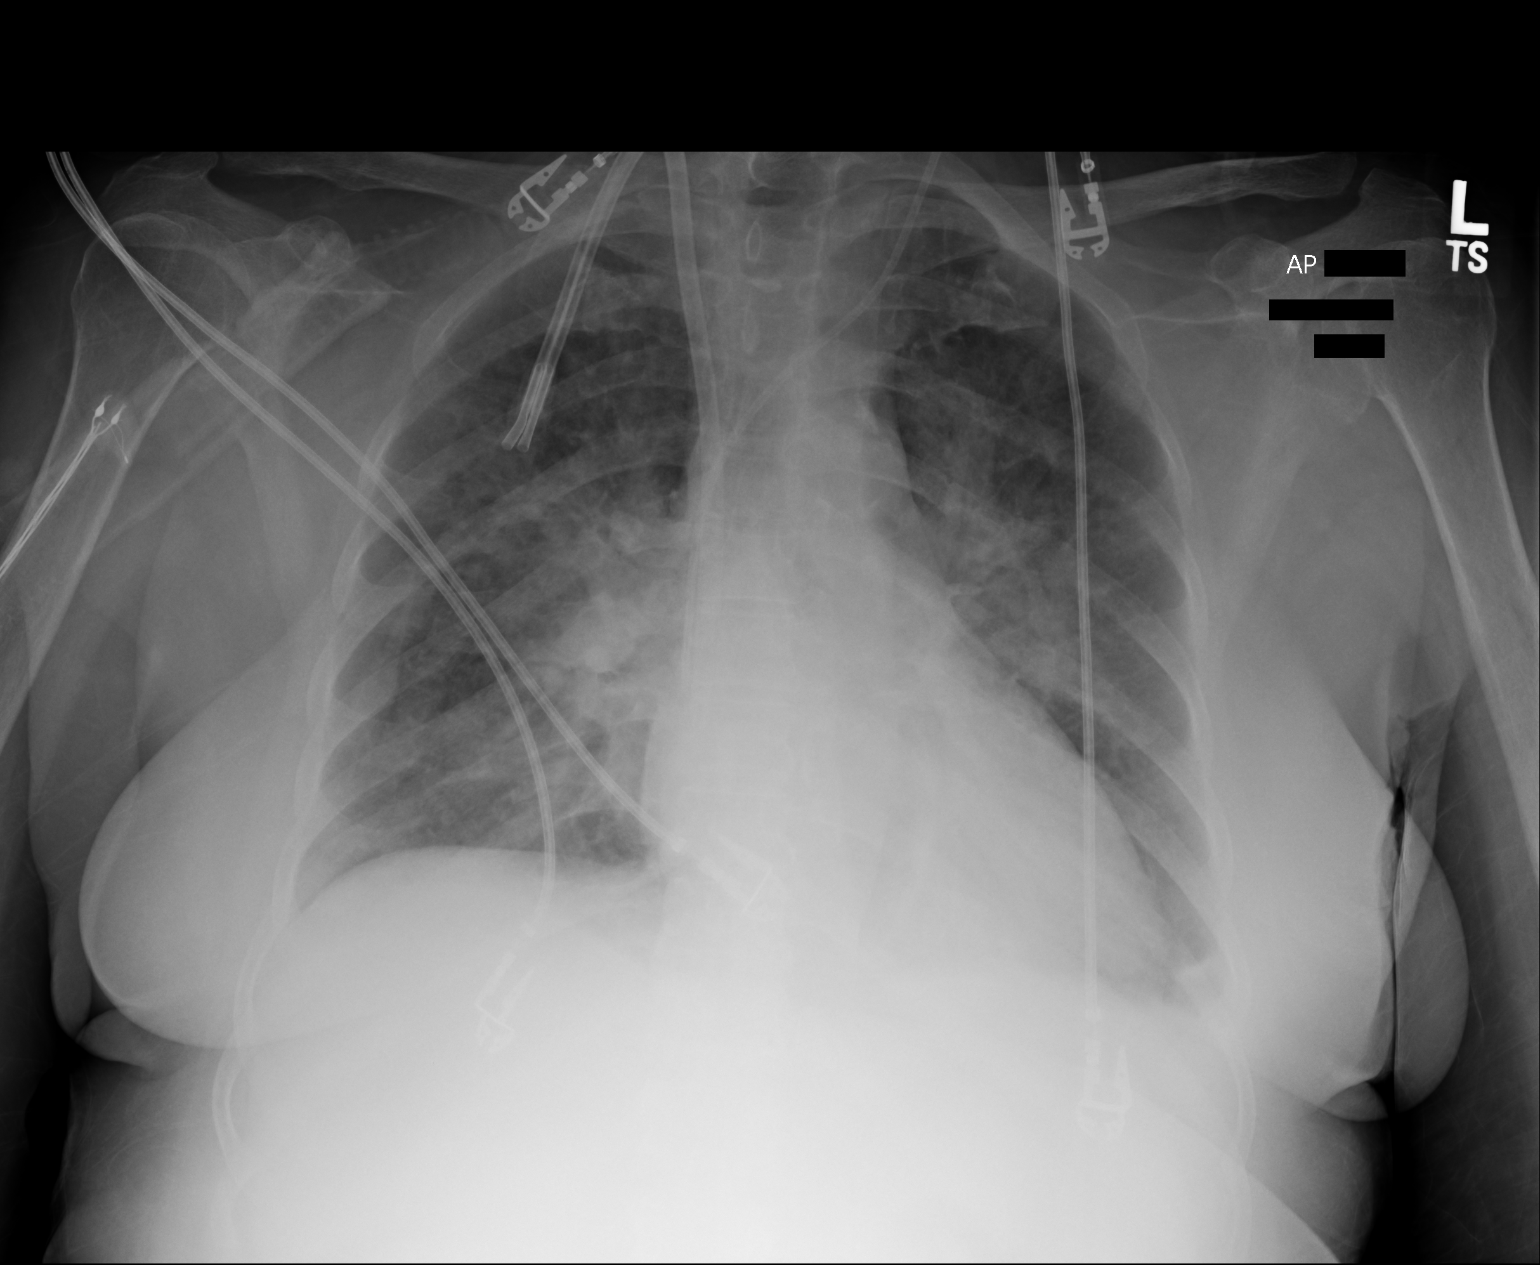

[1 of 1 positions shown; findings below may reference images not displayed]

FINDINGS: Single portable AP chest radiograph is provided. There is bilateral
interstitial thickening. There is enlargement of the central pulmonary
vasculature. There may be a trace left pleural effusion. Normal heart size.
There is a dual lumen right-sided central venous catheter in satisfactory
position. There is a left-sided central venous catheter in satisfactory
position. The osseous structures are unremarkable.
IMPRESSION: Findings most consistent with mild persistent pulmonary edema which appears
improved compared with 07/04/2008.

[REDACTED]

## 2014-09-22 IMAGING — CR DG CHEST 1V PORT
1 series · 1 of 1 positions shown · non-contrast
Comparison: 1 day prior

CLINICAL DATA: Shortness of breath

PORTABLE CHEST - 1 VIEW

[AP]
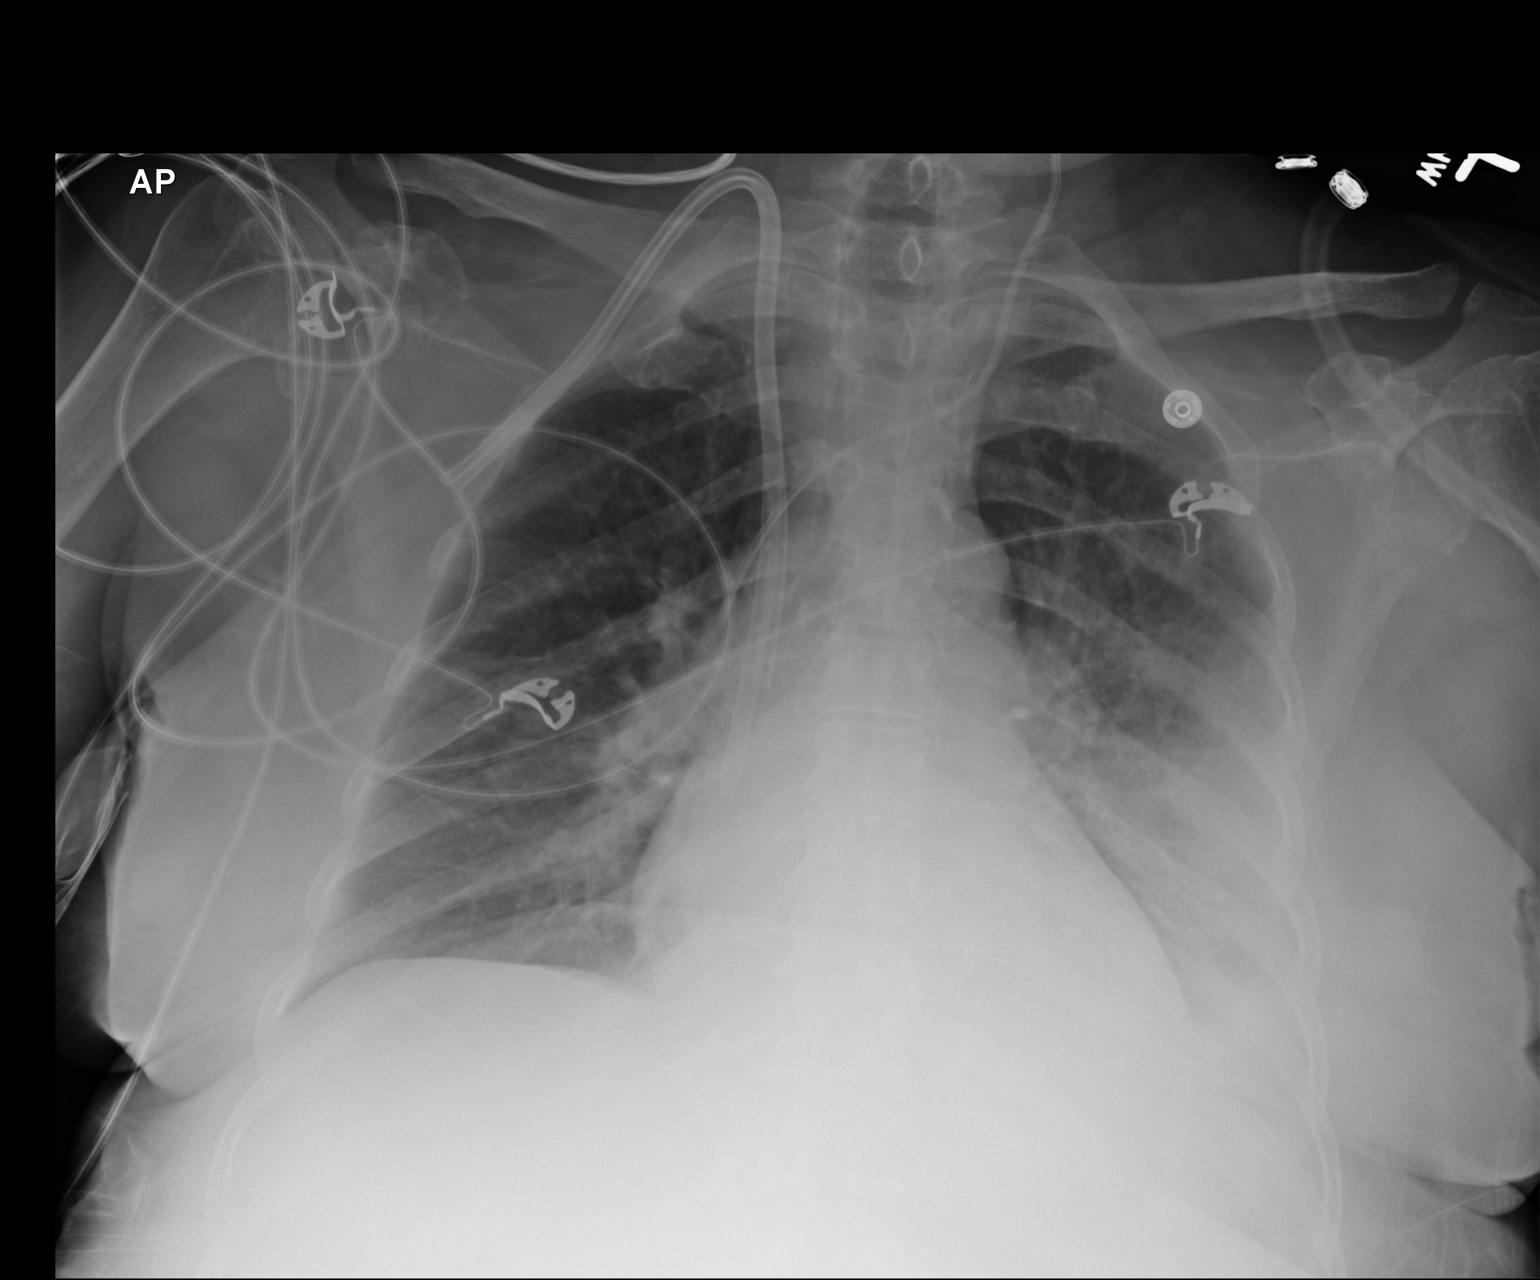

[1 of 1 positions shown; findings below may reference images not displayed]

FINDINGS: Right IJ dialysis catheter unchanged.  Left internal
jugular line terminates at mid SVC.

Apical lordotic patient positioning. Cardiomegaly accentuated by AP
portable technique.  Development of a small left pleural effusion.
No pneumothorax.  Left base air space disease.
IMPRESSION: Development of left base air space disease and adjacent pleural
fluid.  This airspace disease could represent atelectasis or
infection/aspiration.  Asymmetric alveolar pulmonary edema felt
less likely.
